# Patient Record
Sex: Male | Born: 1990 | Race: White | Hispanic: No | Marital: Single | Smoking: Never smoker
Health system: Southern US, Community
[De-identification: ages and names within clinical notes are randomized; demographics above are authoritative.]

## PROBLEM LIST (undated history)

## (undated) HISTORY — PX: BACK SURGERY: SHX140

---

## 2015-01-27 DIAGNOSIS — I10 Essential (primary) hypertension: Secondary | ICD-10-CM | POA: Insufficient documentation

## 2017-12-06 DIAGNOSIS — Z981 Arthrodesis status: Secondary | ICD-10-CM | POA: Insufficient documentation

## 2017-12-06 DIAGNOSIS — M4324 Fusion of spine, thoracic region: Secondary | ICD-10-CM | POA: Insufficient documentation

## 2019-01-18 DIAGNOSIS — G894 Chronic pain syndrome: Secondary | ICD-10-CM | POA: Insufficient documentation

## 2020-02-27 DIAGNOSIS — R531 Weakness: Secondary | ICD-10-CM | POA: Insufficient documentation

## 2020-03-10 DIAGNOSIS — M5412 Radiculopathy, cervical region: Secondary | ICD-10-CM | POA: Insufficient documentation

## 2020-03-10 DIAGNOSIS — F1320 Sedative, hypnotic or anxiolytic dependence, uncomplicated: Secondary | ICD-10-CM | POA: Insufficient documentation

## 2020-03-10 DIAGNOSIS — F1120 Opioid dependence, uncomplicated: Secondary | ICD-10-CM | POA: Insufficient documentation

## 2020-03-10 DIAGNOSIS — F330 Major depressive disorder, recurrent, mild: Secondary | ICD-10-CM | POA: Insufficient documentation

## 2020-03-10 DIAGNOSIS — F419 Anxiety disorder, unspecified: Secondary | ICD-10-CM | POA: Insufficient documentation

## 2020-03-13 DIAGNOSIS — K219 Gastro-esophageal reflux disease without esophagitis: Secondary | ICD-10-CM | POA: Insufficient documentation

## 2020-05-30 DIAGNOSIS — N319 Neuromuscular dysfunction of bladder, unspecified: Secondary | ICD-10-CM | POA: Insufficient documentation

## 2020-07-20 ENCOUNTER — Observation Stay (HOSPITAL_COMMUNITY): Payer: Medicaid Other

## 2020-07-20 ENCOUNTER — Emergency Department (HOSPITAL_COMMUNITY): Payer: Medicaid Other

## 2020-07-20 ENCOUNTER — Inpatient Hospital Stay (HOSPITAL_COMMUNITY)
Admission: EM | Admit: 2020-07-20 | Discharge: 2020-07-26 | DRG: 880 | Disposition: A | Discharge status: Home / Self Care | Payer: Medicaid Other | Attending: Family Medicine | Admitting: Family Medicine

## 2020-07-20 ENCOUNTER — Other Ambulatory Visit: Payer: Self-pay

## 2020-07-20 ENCOUNTER — Encounter (HOSPITAL_COMMUNITY): Payer: Self-pay | Admitting: Emergency Medicine

## 2020-07-20 DIAGNOSIS — I10 Essential (primary) hypertension: Secondary | ICD-10-CM | POA: Diagnosis present

## 2020-07-20 DIAGNOSIS — Z20822 Contact with and (suspected) exposure to covid-19: Secondary | ICD-10-CM | POA: Diagnosis present

## 2020-07-20 DIAGNOSIS — G8929 Other chronic pain: Secondary | ICD-10-CM | POA: Diagnosis present

## 2020-07-20 DIAGNOSIS — F449 Dissociative and conversion disorder, unspecified: Principal | ICD-10-CM | POA: Diagnosis present

## 2020-07-20 DIAGNOSIS — M549 Dorsalgia, unspecified: Secondary | ICD-10-CM

## 2020-07-20 DIAGNOSIS — F329 Major depressive disorder, single episode, unspecified: Secondary | ICD-10-CM | POA: Diagnosis present

## 2020-07-20 DIAGNOSIS — E669 Obesity, unspecified: Secondary | ICD-10-CM | POA: Diagnosis present

## 2020-07-20 DIAGNOSIS — N3946 Mixed incontinence: Secondary | ICD-10-CM | POA: Diagnosis present

## 2020-07-20 DIAGNOSIS — Z7984 Long term (current) use of oral hypoglycemic drugs: Secondary | ICD-10-CM

## 2020-07-20 DIAGNOSIS — R262 Difficulty in walking, not elsewhere classified: Secondary | ICD-10-CM | POA: Diagnosis present

## 2020-07-20 DIAGNOSIS — Z8249 Family history of ischemic heart disease and other diseases of the circulatory system: Secondary | ICD-10-CM

## 2020-07-20 DIAGNOSIS — M545 Low back pain, unspecified: Secondary | ICD-10-CM

## 2020-07-20 DIAGNOSIS — Z981 Arthrodesis status: Secondary | ICD-10-CM

## 2020-07-20 DIAGNOSIS — R32 Unspecified urinary incontinence: Secondary | ICD-10-CM

## 2020-07-20 DIAGNOSIS — R630 Anorexia: Secondary | ICD-10-CM | POA: Diagnosis present

## 2020-07-20 DIAGNOSIS — F339 Major depressive disorder, recurrent, unspecified: Secondary | ICD-10-CM

## 2020-07-20 DIAGNOSIS — Z6841 Body Mass Index (BMI) 40.0 and over, adult: Secondary | ICD-10-CM

## 2020-07-20 DIAGNOSIS — R52 Pain, unspecified: Secondary | ICD-10-CM | POA: Diagnosis not present

## 2020-07-20 DIAGNOSIS — F411 Generalized anxiety disorder: Secondary | ICD-10-CM | POA: Diagnosis present

## 2020-07-20 DIAGNOSIS — R29898 Other symptoms and signs involving the musculoskeletal system: Secondary | ICD-10-CM

## 2020-07-20 DIAGNOSIS — R339 Retention of urine, unspecified: Secondary | ICD-10-CM | POA: Diagnosis present

## 2020-07-20 LAB — TYPE AND SCREEN
ABO/RH(D): A POS
Antibody Screen: NEGATIVE

## 2020-07-20 LAB — CREATININE, SERUM
Creatinine, Ser: 0.83 mg/dL (ref 0.61–1.24)
GFR, Estimated: >60 mL/min (ref >60)

## 2020-07-20 LAB — CBC WITH DIFFERENTIAL/PLATELET
Abs Immature Granulocytes: 0.02 10*3/uL (ref 0.00–0.07)
Basophils Absolute: 0 10*3/uL (ref 0.0–0.1)
Basophils Relative: 1 %
Eosinophils Absolute: 0 10*3/uL (ref 0.0–0.5)
Eosinophils Relative: 0 %
HCT: 44.3 % (ref 39.0–52.0)
Hemoglobin: 14.6 g/dL (ref 13.0–17.0)
Immature Granulocytes: 0 %
Lymphocytes Relative: 26 %
Lymphs Abs: 2.3 10*3/uL (ref 0.7–4.0)
MCH: 29.9 pg (ref 26.0–34.0)
MCHC: 33 g/dL (ref 30.0–36.0)
MCV: 90.8 fL (ref 80.0–100.0)
Monocytes Absolute: 0.6 10*3/uL (ref 0.1–1.0)
Monocytes Relative: 7 %
Neutro Abs: 5.8 10*3/uL (ref 1.7–7.7)
Neutrophils Relative %: 66 %
Platelets: 303 10*3/uL (ref 150–400)
RBC: 4.88 MIL/uL (ref 4.22–5.81)
RDW: 13.5 % (ref 11.5–15.5)
WBC: 8.7 10*3/uL (ref 4.0–10.5)
nRBC: 0 % (ref 0.0–0.2)

## 2020-07-20 LAB — RAPID URINE DRUG SCREEN, HOSP PERFORMED
Amphetamines: NOT DETECTED
Barbiturates: NOT DETECTED
Benzodiazepines: POSITIVE — AB
Cocaine: NOT DETECTED
Opiates: NOT DETECTED
Tetrahydrocannabinol: NOT DETECTED

## 2020-07-20 LAB — CBC
HCT: 41.2 % (ref 39.0–52.0)
Hemoglobin: 13.7 g/dL (ref 13.0–17.0)
MCH: 30 pg (ref 26.0–34.0)
MCHC: 33.3 g/dL (ref 30.0–36.0)
MCV: 90.4 fL (ref 80.0–100.0)
Platelets: 268 10*3/uL (ref 150–400)
RBC: 4.56 MIL/uL (ref 4.22–5.81)
RDW: 13.5 % (ref 11.5–15.5)
WBC: 10.2 10*3/uL (ref 4.0–10.5)
nRBC: 0 % (ref 0.0–0.2)

## 2020-07-20 LAB — RESP PANEL BY RT-PCR (FLU A&B, COVID) ARPGX2
Influenza A by PCR: NEGATIVE
Influenza B by PCR: NEGATIVE
SARS Coronavirus 2 by RT PCR: NEGATIVE

## 2020-07-20 LAB — BASIC METABOLIC PANEL
Anion gap: 10 (ref 5–15)
BUN: 9 mg/dL (ref 6–20)
CO2: 28 mmol/L (ref 22–32)
Calcium: 9.4 mg/dL (ref 8.9–10.3)
Chloride: 101 mmol/L (ref 98–111)
Creatinine, Ser: 0.87 mg/dL (ref 0.61–1.24)
GFR, Estimated: >60 mL/min (ref >60)
Glucose, Bld: 100 mg/dL — ABNORMAL HIGH (ref 70–99)
Potassium: 3.8 mmol/L (ref 3.5–5.1)
Sodium: 139 mmol/L (ref 135–145)

## 2020-07-20 LAB — ABO/RH: ABO/RH(D): A POS

## 2020-07-20 LAB — HIV ANTIBODY (ROUTINE TESTING W REFLEX): HIV Screen 4th Generation wRfx: NONREACTIVE

## 2020-07-20 IMAGING — MR MR LUMBAR SPINE W/O CM
5 of 6 series · 29 of 48 positions shown · non-contrast
Comparison: Radiography earlier same day

CLINICAL DATA: Fell yesterday with leg weakness and back pain.
Unable to urinate.

EXAM:
MRI LUMBAR SPINE WITHOUT CONTRAST
TECHNIQUE: Multiplanar, multisequence MR imaging of the lumbar spine was
performed. No intravenous contrast was administered.

[Series 16: t2_tse_warp_sag · sagittal · 4.0mm · 0.81mm/px · 3 of 17 slices shown]
[im 1/17]
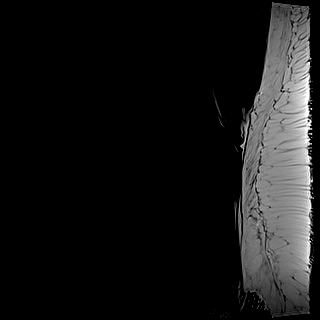
[im 9/17]
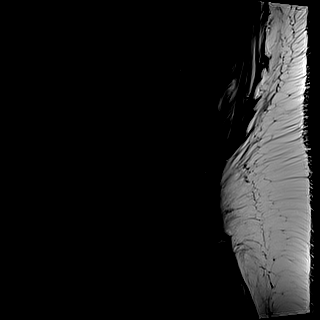
[im 17/17]
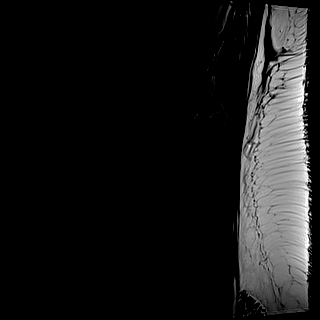

[Series 17: t2_tse_stir_warp_sag · sagittal · 4.0mm · 1.02mm/px · 3 of 17 slices shown]
[im 1/17]
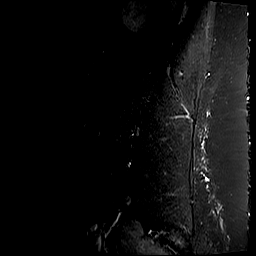
[im 9/17]
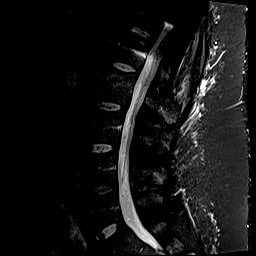
[im 17/17]
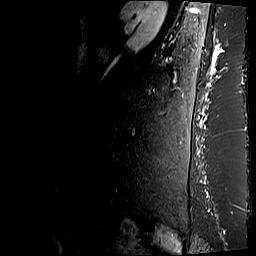

[Series 18: t2_tse_warp_tra · axial · 4.0mm · 0.78mm/px · z∈[-616,-365]mm · 10 of 54 slices shown]
[im 1/54]
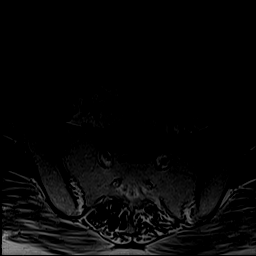
[im 6/54]
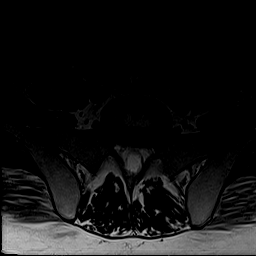
[im 12/54]
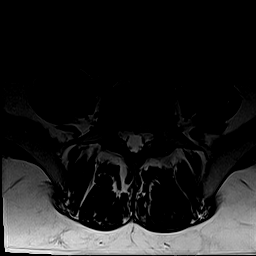
[im 18/54]
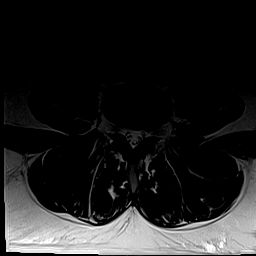
[im 24/54]
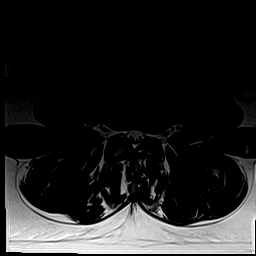
[im 30/54]
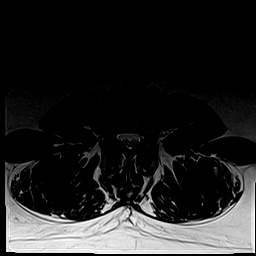
[im 36/54]
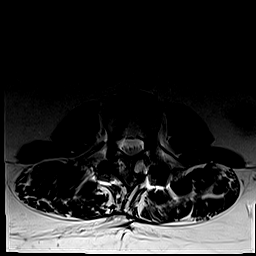
[im 42/54]
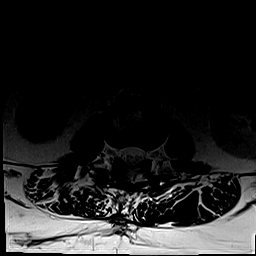
[im 48/54]
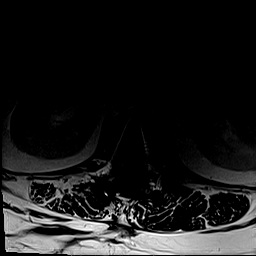
[im 54/54]
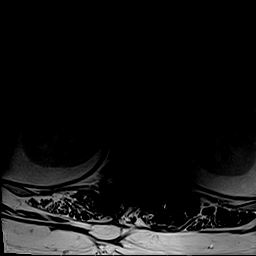

[Series 19: t1_tse_warp_tra · axial · 4.0mm · 0.39mm/px · z∈[-616,-365]mm · 10 of 54 slices shown]
[im 1/54]
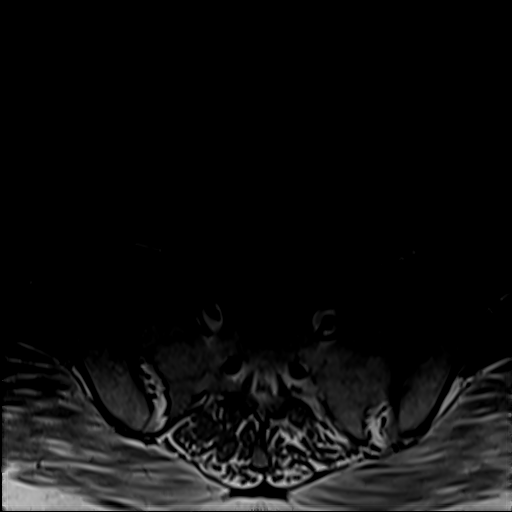
[im 6/54]
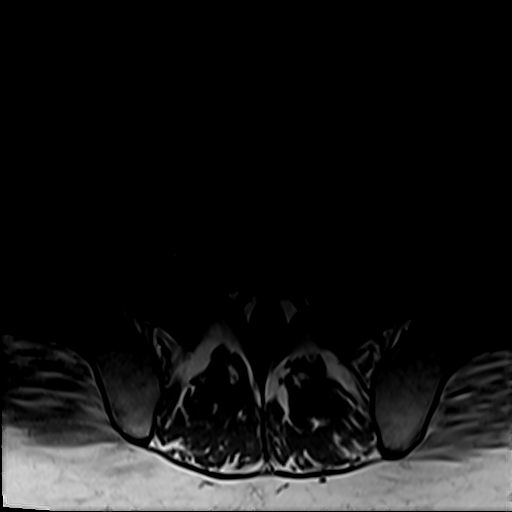
[im 12/54]
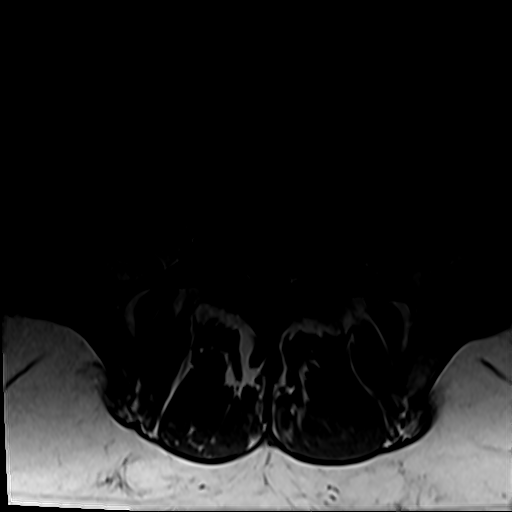
[im 18/54]
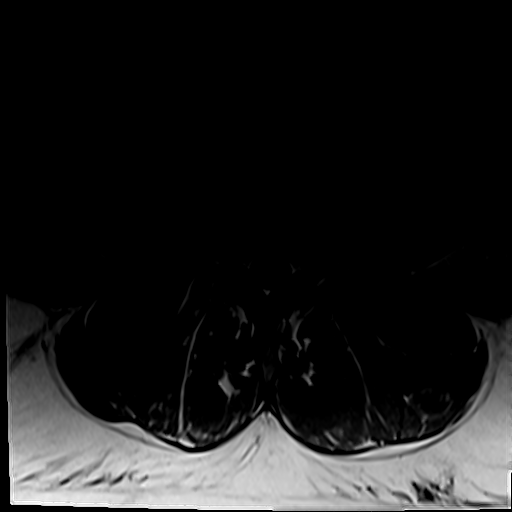
[im 24/54]
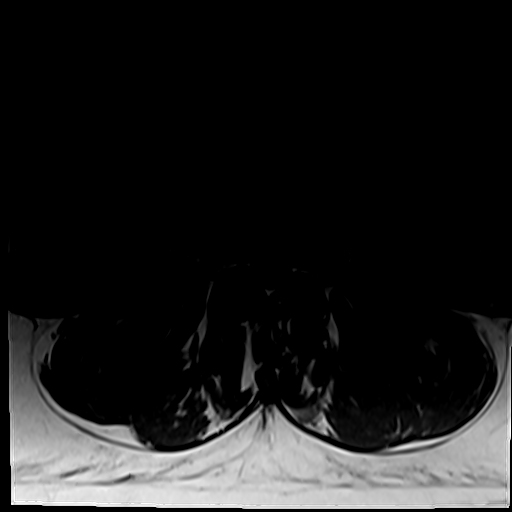
[im 30/54]
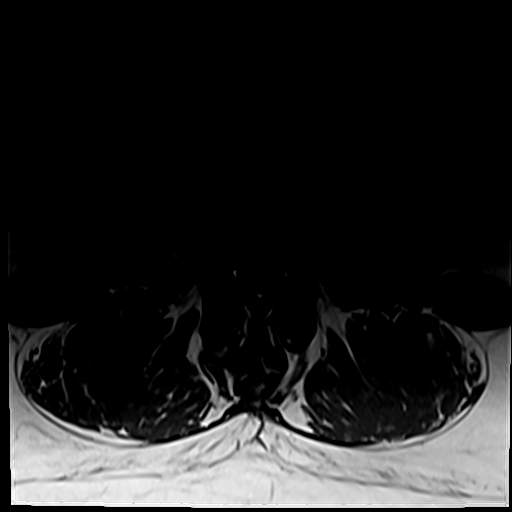
[im 36/54]
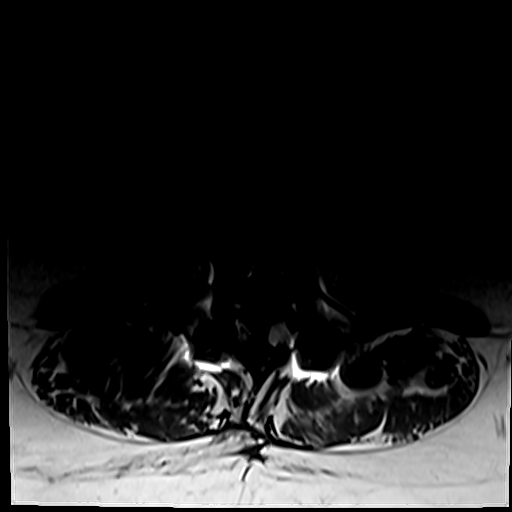
[im 42/54]
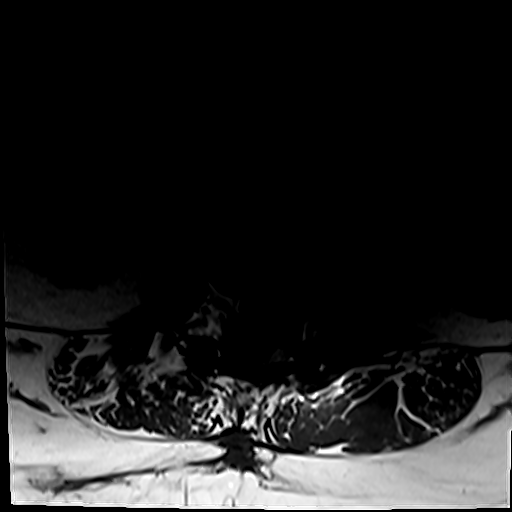
[im 48/54]
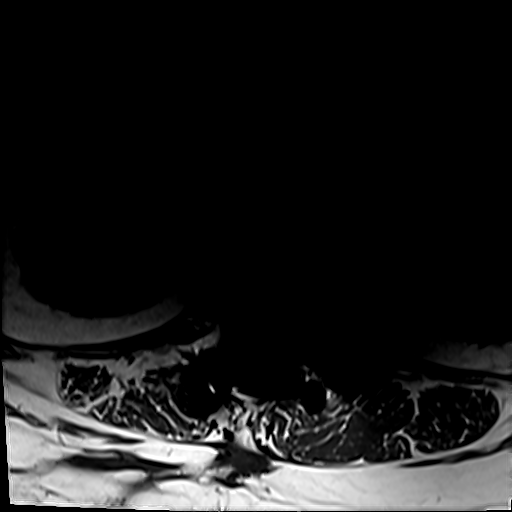
[im 54/54]
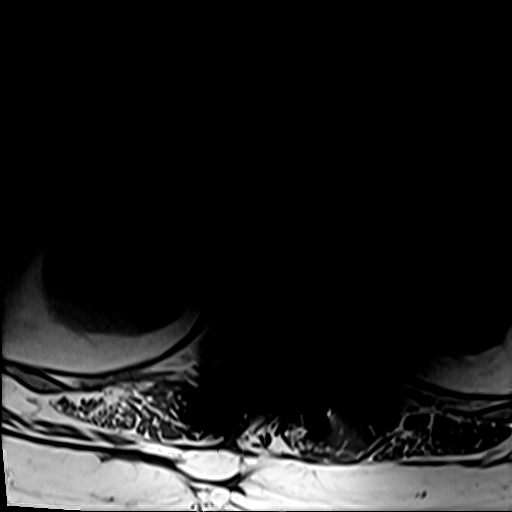

[Series 20: t1_tse_warp_sag · sagittal · 4.0mm · 0.81mm/px · 3 of 17 slices shown]
[im 1/17]
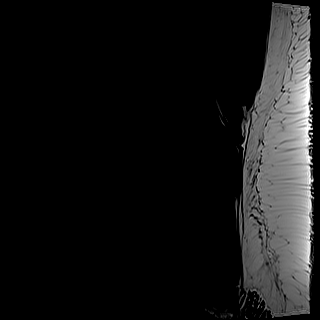
[im 9/17]
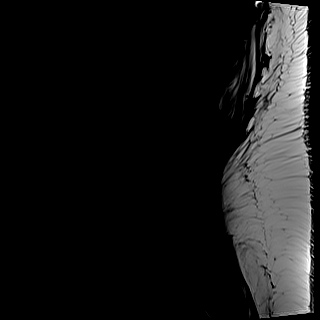
[im 17/17]
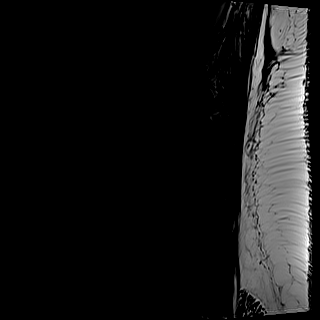

[29 of 48 positions shown; findings below may reference images not displayed]

FINDINGS: Segmentation:  5 lumbar type vertebral bodies.

Alignment:  Normal

Vertebrae: Thoracolumbar fusion extending as low as L2. No
abnormality seen below that.

Conus medullaris and cauda equina: Conus extends to the L1 level.
Conus and cauda equina appear normal.

Paraspinal and other soft tissues: Negative

Disc levels:

L1-2: Normal.  Solid fusion.

L2-3: No disc abnormality. Mild facet degeneration and hypertrophy.
No compressive stenosis.

L3-4: No disc abnormality. Minimal facet and ligamentous prominence.
No stenosis.

L4-5: Minimal bulging of the disc. Minimal facet and ligamentous
prominence. No compressive stenosis.

L5-S1: Normal interspace.
IMPRESSION: Thoracolumbar fusion extends down as far as L2. The L1-2 level is
unremarkable. No compressive stenosis from L2-3 through L5-S1. At
L3-4, there is some adjacent segment facet degeneration and
hypertrophy but without discernible edematous change or compressive
encroachment upon the neural spaces.

## 2020-07-20 IMAGING — DX DG THORACOLUMBAR SPINE 2V
6 series · 6 of 6 positions shown · non-contrast
Comparison: None

CLINICAL DATA: Fell yesterday, legs came out, severe back pain,
groin numbness, unable to urinate, history of back surgery

EXAM:
THORACOLUMBAR SPINE 1V

[l-spine spot]
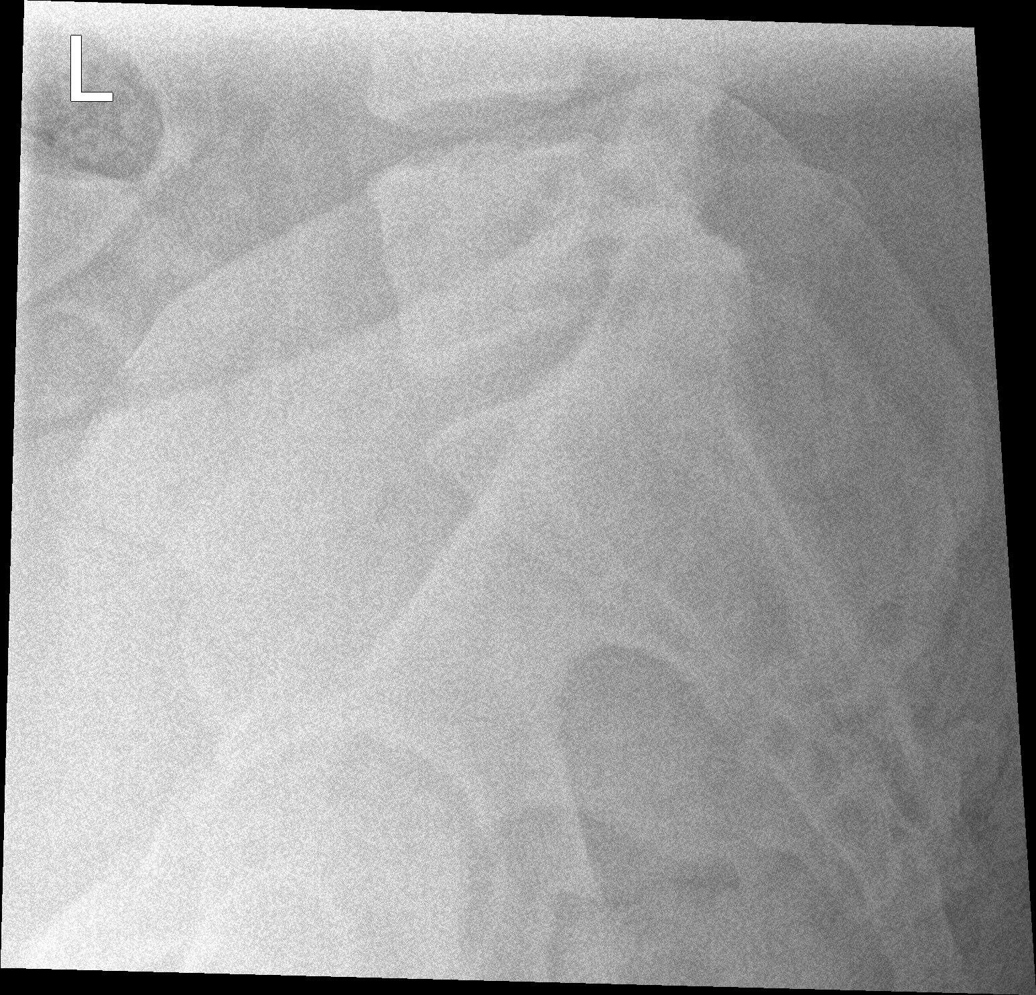

[l-spine ap (1 of 4)]
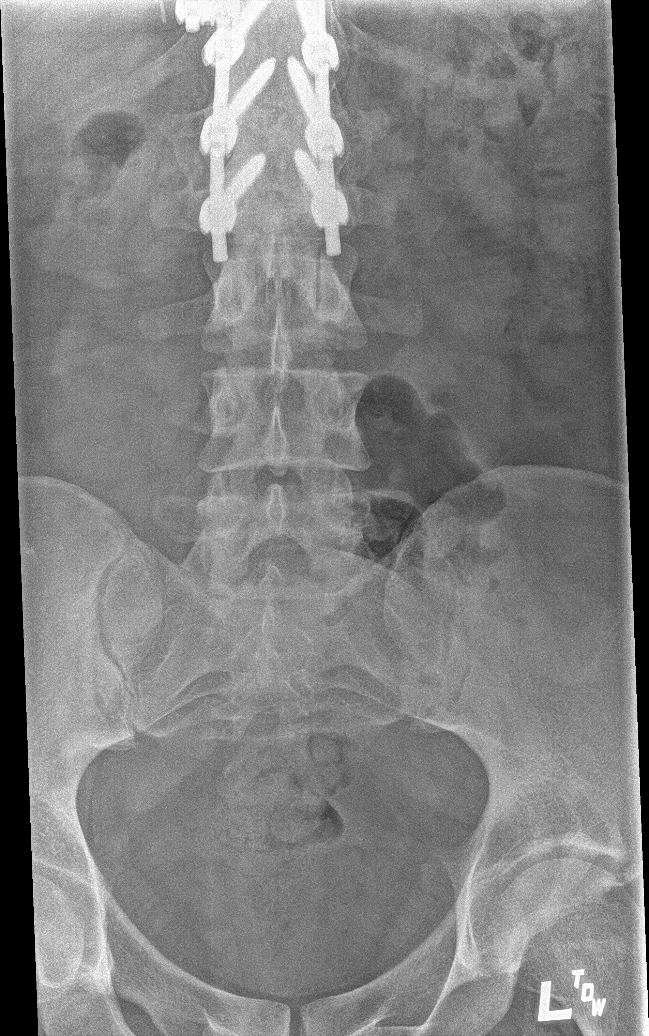

[l-spine ap (2 of 4)]
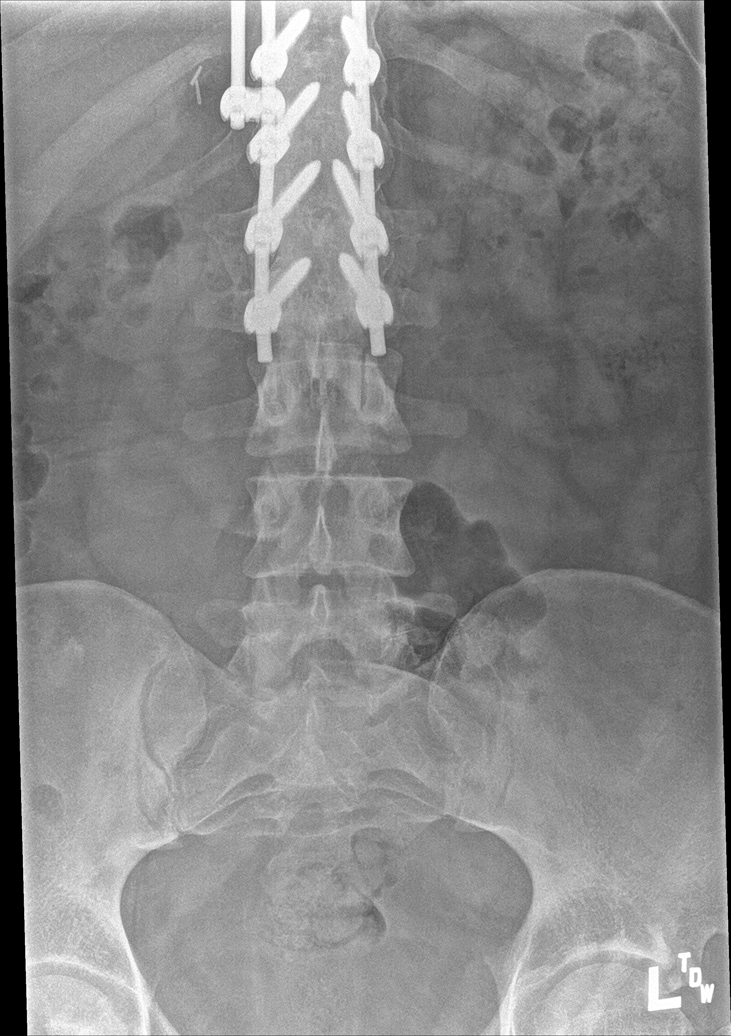

[l-spine ap (3 of 4)]
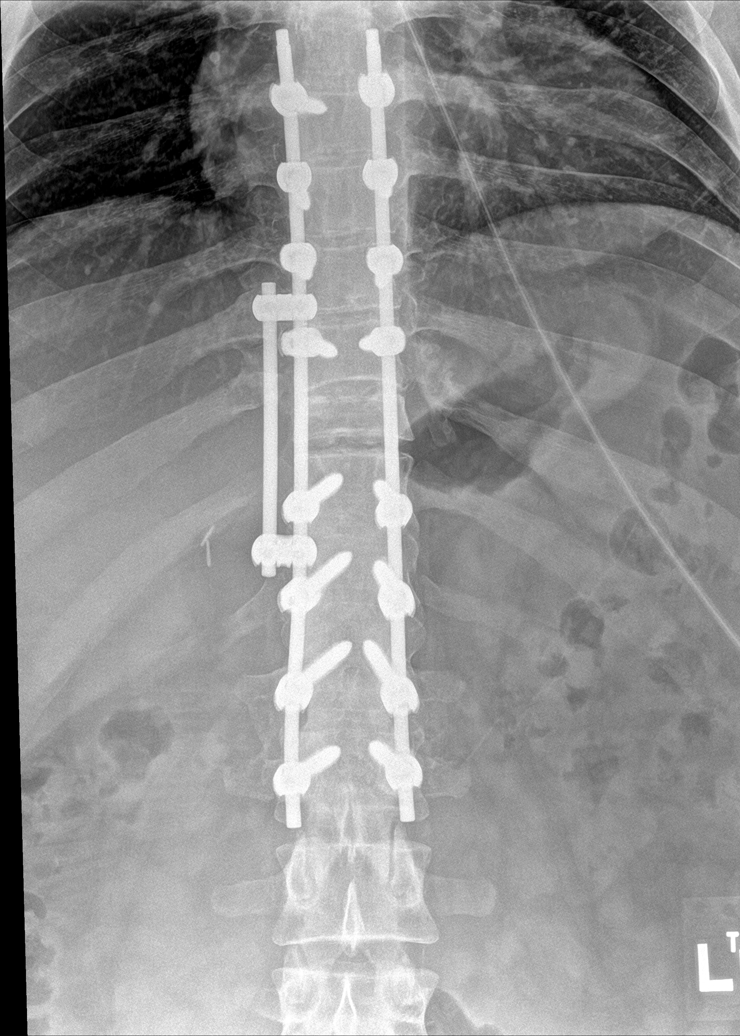

[l-spine lat]
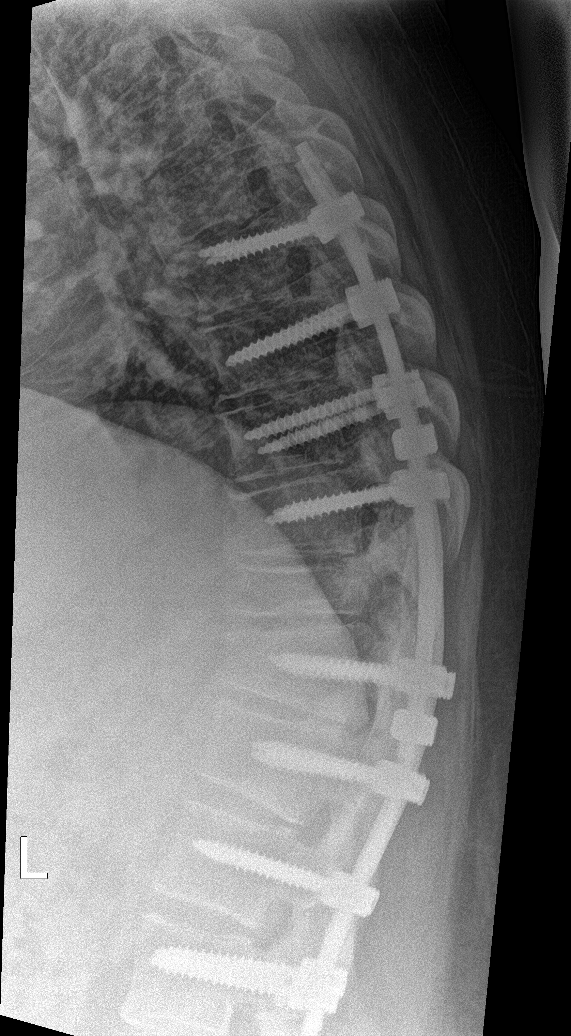

[l-spine ap (4 of 4)]
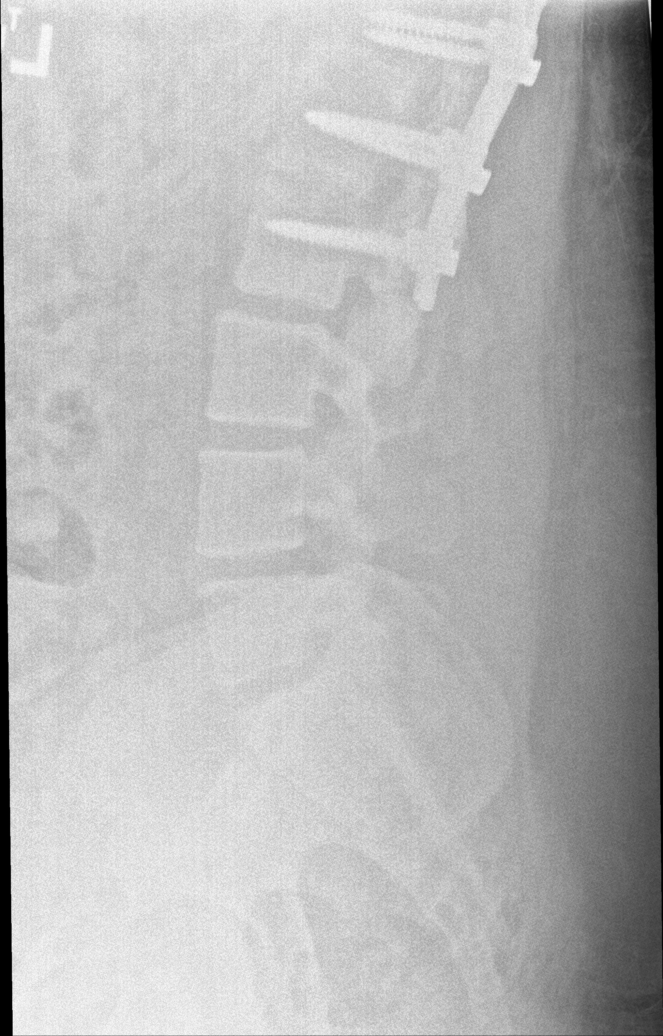

[6 of 6 positions shown; findings below may reference images not displayed]

FINDINGS: Prior thoracolumbar fusion, T6-L2.

Hardware appears intact.

5 non-rib-bearing lumbar vertebra.

Compression fractures of T10 and superior endplates of T9 and T11,
appear old.

Remaining vertebral body heights maintained.

No additional fracture,, subluxation, or bone destruction.

SI joints preserved.
IMPRESSION: Prior T6-L2 fusion.

Old appearing compression fractures of T10 and superior endplate T9.

No acute osseous abnormalities.

## 2020-07-20 IMAGING — MR MR THORACIC SPINE W/O CM
8 of 12 series · 23 of 48 positions shown · non-contrast
Comparison: Radiography earlier same day

CLINICAL DATA: Back pain.  Fell.  Unable to urinate.

EXAM:
MRI THORACIC SPINE WITHOUT CONTRAST
TECHNIQUE: Multiplanar, multisequence MR imaging of the thoracic spine was
performed. No intravenous contrast was administered.

[Series 21: T1 · sagittal · 3.3mm · 0.62mm/px · 1 of 7 slices shown (1 of 5)]
[im 1/7]
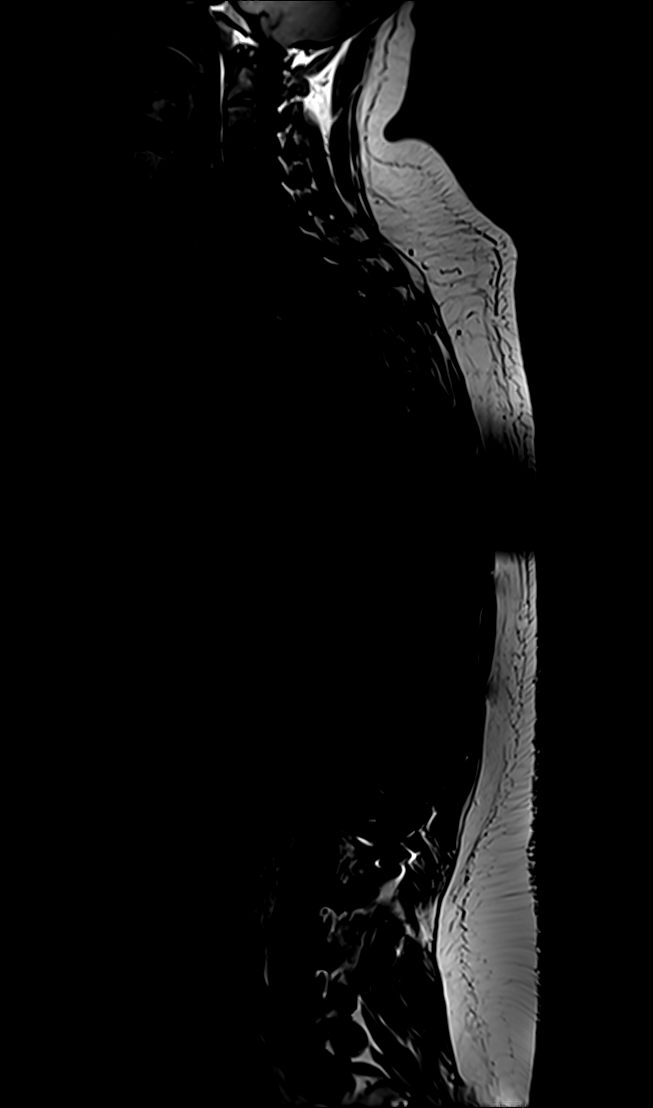

[Series 22: T2 · sagittal · 3.0mm · 0.80mm/px · 3 of 19 slices shown (1 of 3)]
[im 1/19]
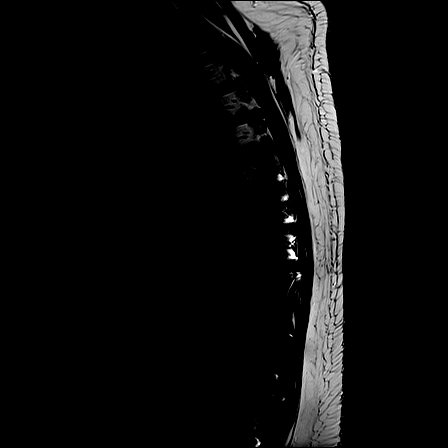
[im 10/19]
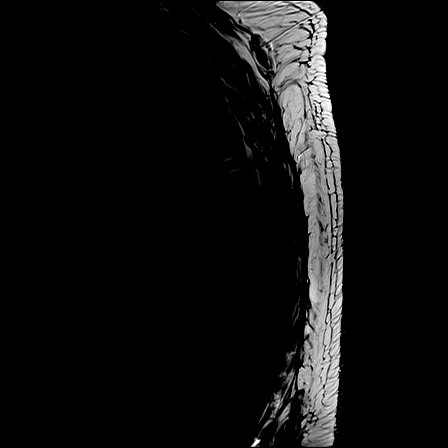
[im 19/19]
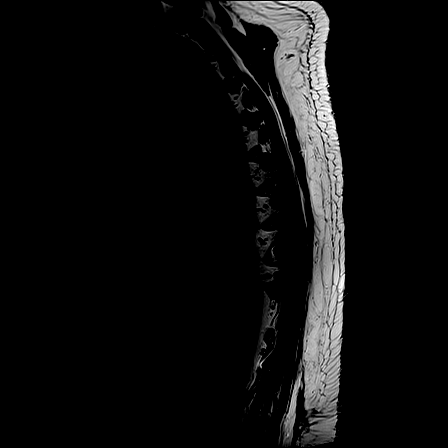

[Series 23: T1 · sagittal · 3.0mm · 0.80mm/px · 3 of 19 slices shown (2 of 5)]
[im 1/19]
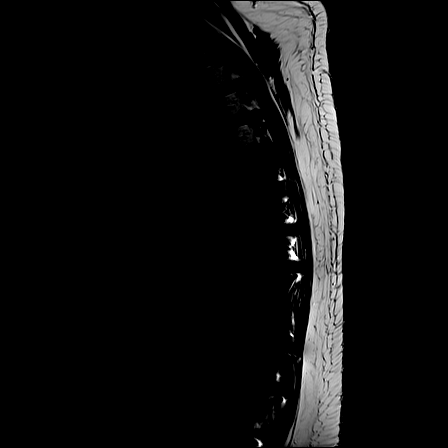
[im 10/19]
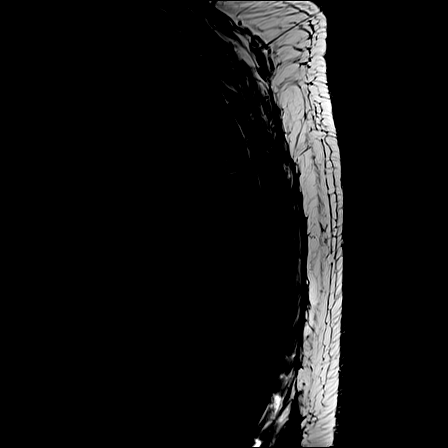
[im 19/19]
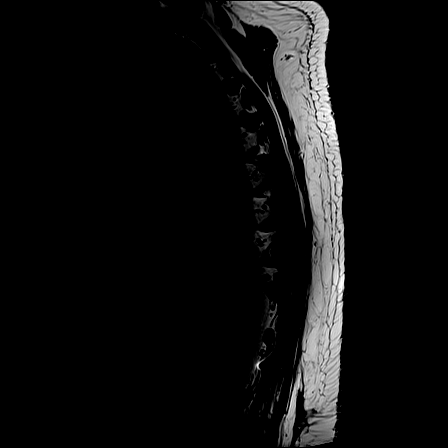

[Series 25: T1 · sagittal · 3.0mm · 1.12mm/px · 3 of 19 slices shown (3 of 5)]
[im 1/19]
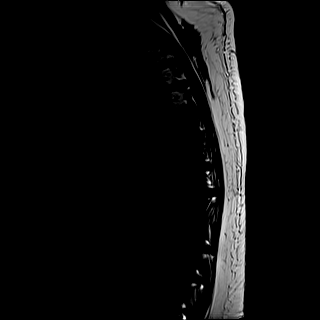
[im 10/19]
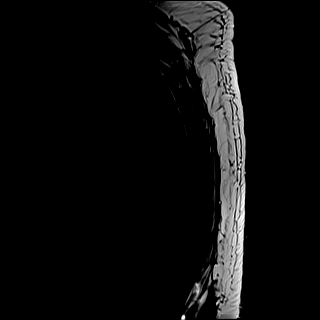
[im 19/19]
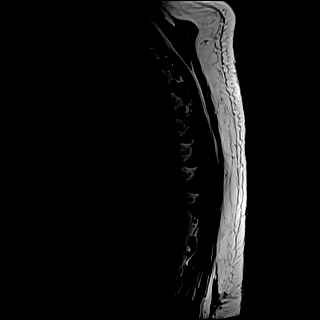

[Series 26: T2 · axial · 5.0mm · 0.59mm/px · z∈[-282,-89]mm · 4 of 28 slices shown (2 of 3)]
[im 1/28]
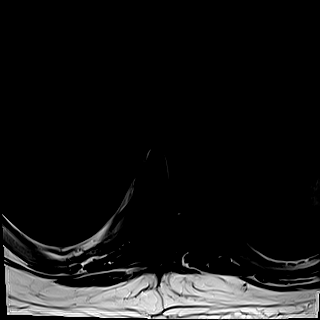
[im 10/28]
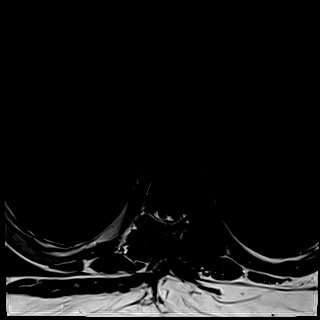
[im 19/28]
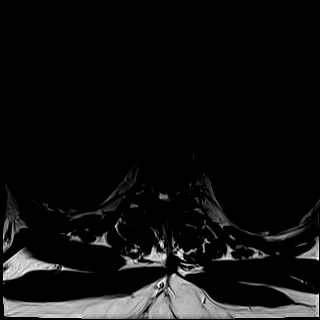
[im 28/28]
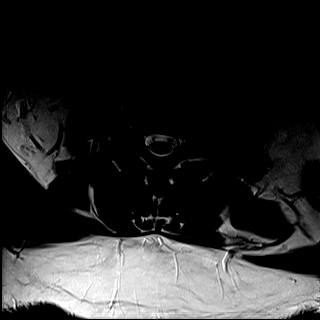

[Series 27: T2 · axial · 5.0mm · 0.59mm/px · z∈[-421,-249]mm · 3 of 24 slices shown (3 of 3)]
[im 1/24]
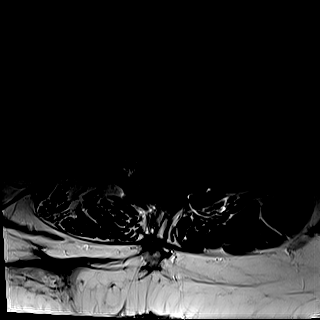
[im 12/24]
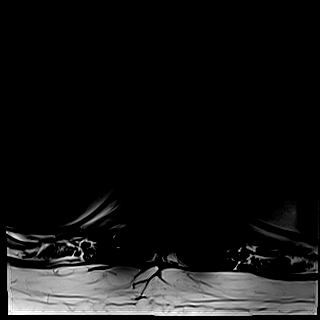
[im 24/24]
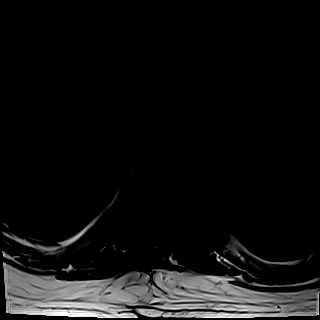

[Series 29: T1 · axial · non-contrast · 5.0mm · 0.31mm/px · z∈[-282,-89]mm · 4 of 28 slices shown (4 of 5)]
[im 1/28]
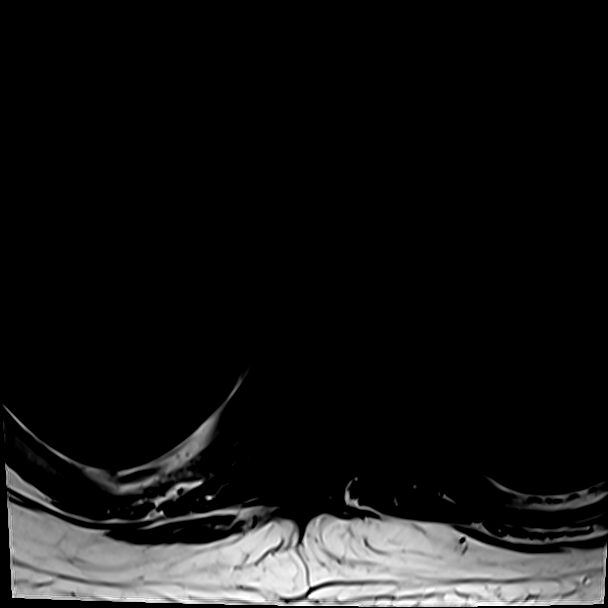
[im 10/28]
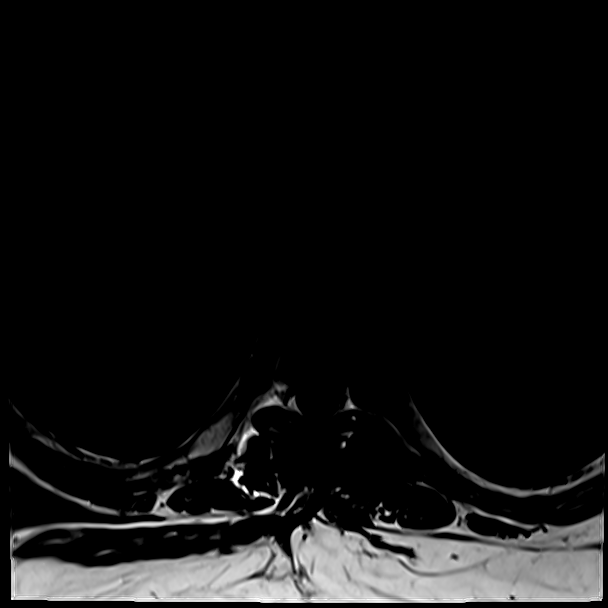
[im 19/28]
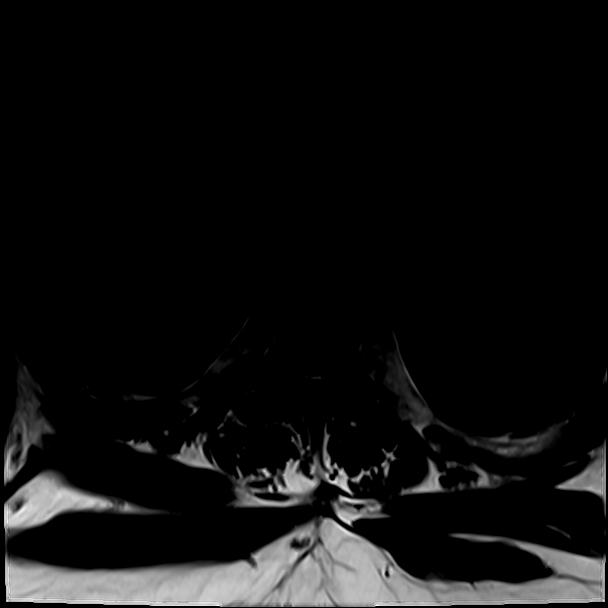
[im 28/28]
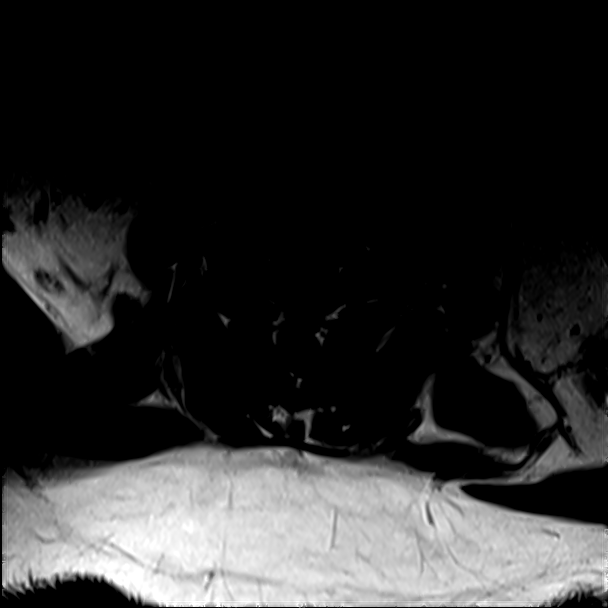

[Series 30: T1 · axial · non-contrast · 5.0mm · 0.31mm/px · z∈[-421,-339]mm · 2 of 24 slices shown (5 of 5)]
[im 1/24]
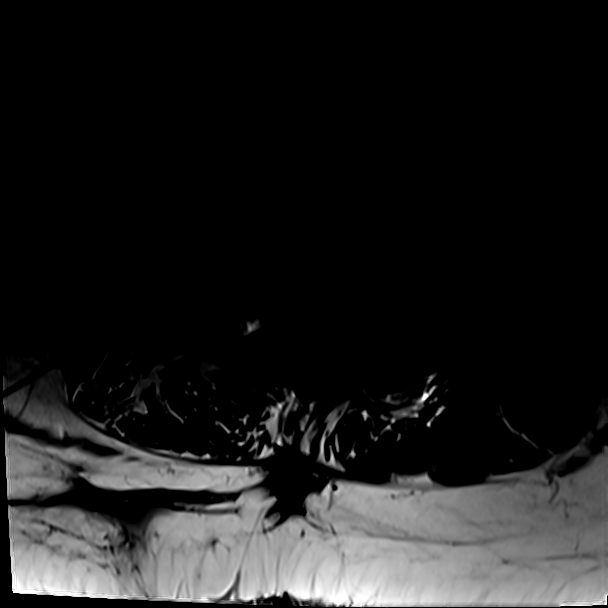
[im 12/24]
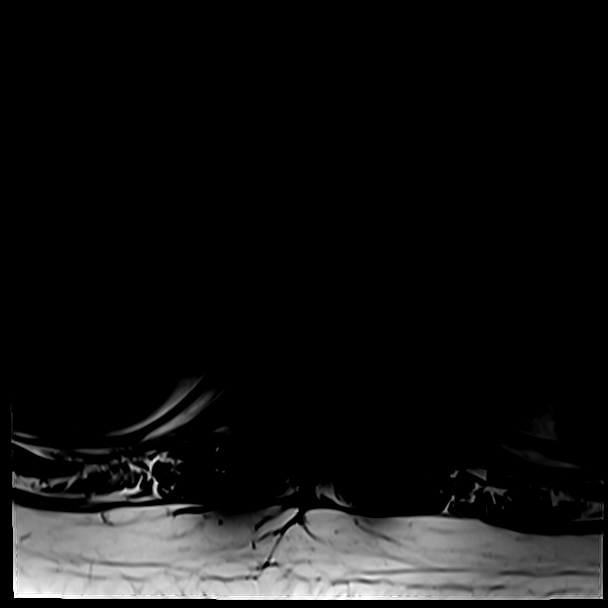

[23 of 48 positions shown; findings below may reference images not displayed]

FINDINGS: Alignment:  No malalignment.

Vertebrae: Thoracolumbar fusion procedure from T6-L2. Old
compression fracture at T10 with loss of height of 50%. Mild loss of
height also at T11 and T12.

Cord:  No cord compression or primary cord lesion.

Paraspinal and other soft tissues: No paravertebral abnormality
identified.

Disc levels:

No disc space abnormality at T5-6 or above. Mild facet and
ligamentous hypertrophy at T4-5 and T5-6 without encroachment upon
the neural structures. This could contribute to upper thoracic back
pain. The left-sided pedicle screw at T6 comes very close to the
disc space, but there does not appear to be T5-6 disc degeneration.

T6 through T12: Previous fusion procedure as above. Limited detail
because of artifact from fusion hardware. No apparent acute or
unexpected finding in that region. Apparent sufficient patency of
the canal and foramina, allowing for the artifact.
IMPRESSION: No acute traumatic finding. Previous thoracolumbar fusion from T6-L2
has a satisfactory appearance.

Mild facet and ligamentous degenerative changes and hypertrophy at
T4-5 and T5-6, but without compressive encroachment upon the canal
or foramina.

## 2020-07-20 IMAGING — MR MR CERVICAL SPINE W/O CM
5 series · 38 of 48 positions shown · non-contrast
Comparison: None.

CLINICAL DATA: Lower extremity weakness

EXAM:
MRI CERVICAL SPINE WITHOUT CONTRAST
TECHNIQUE: Multiplanar, multisequence MR imaging of the cervical spine was
performed. No intravenous contrast was administered.

[Series 5: T2 · sagittal · 3.0mm · 0.69mm/px · 6 of 15 slices shown (1 of 2)]
[im 1/15]
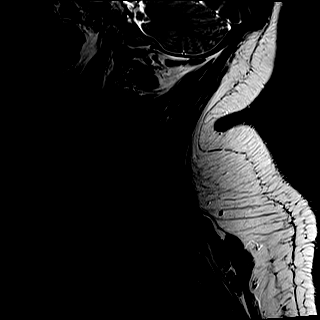
[im 3/15]
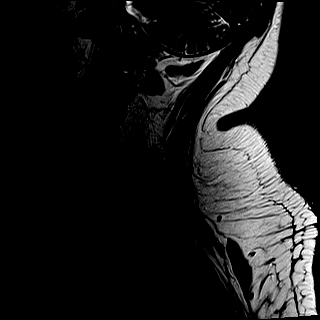
[im 6/15]
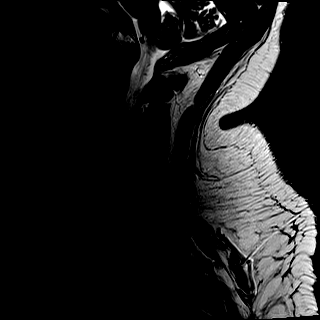
[im 9/15]
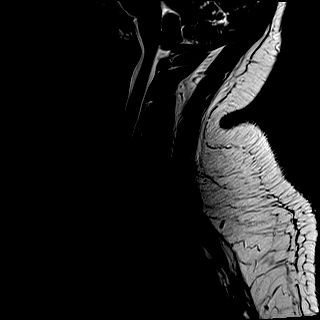
[im 12/15]
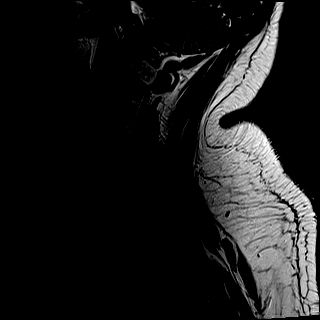
[im 15/15]
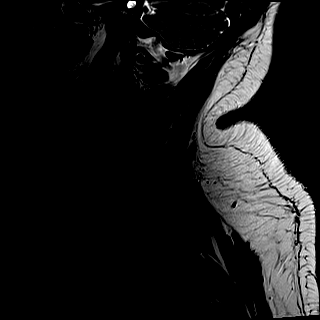

[Series 6: T1 · sagittal · 3.0mm · 0.69mm/px · 6 of 15 slices shown]
[im 1/15]
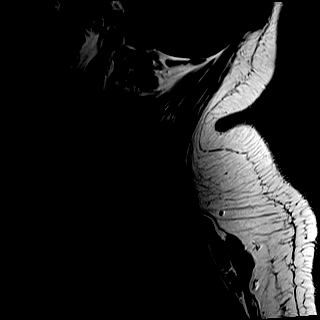
[im 3/15]
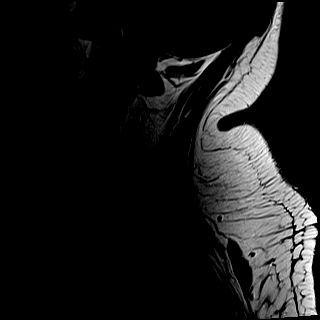
[im 6/15]
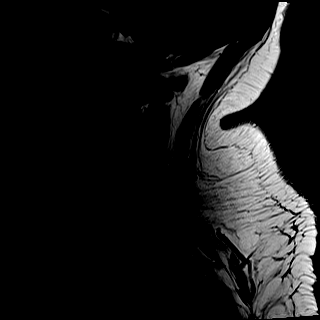
[im 9/15]
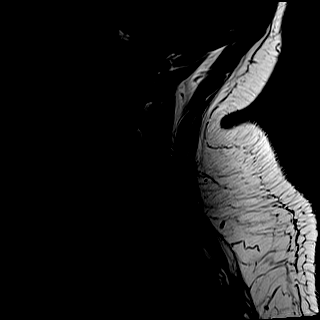
[im 12/15]
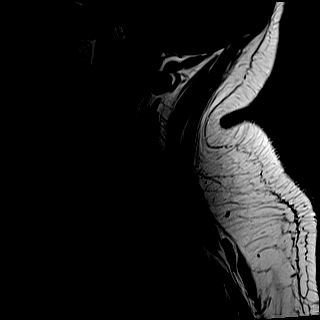
[im 15/15]
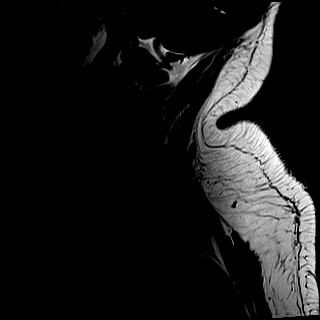

[Series 7: STIR · sagittal · 3.0mm · 0.86mm/px · 6 of 15 slices shown]
[im 1/15]
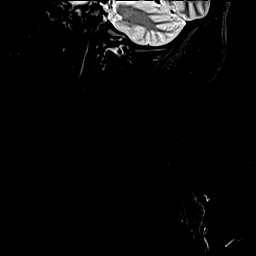
[im 3/15]
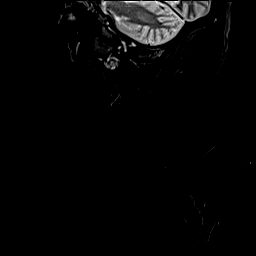
[im 6/15]
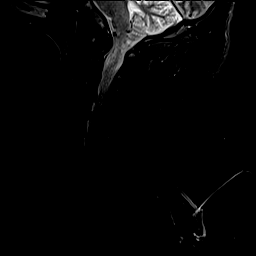
[im 9/15]
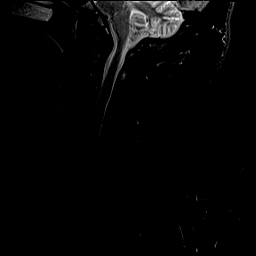
[im 12/15]
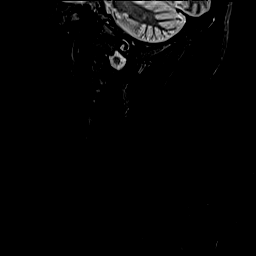
[im 15/15]
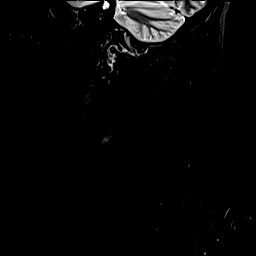

[Series 8: T2 · axial · 3.0mm · 0.66mm/px · z∈[-116,+12]mm · 12 of 40 slices shown (2 of 2)]
[im 1/40]
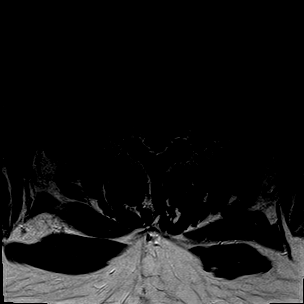
[im 3/40]
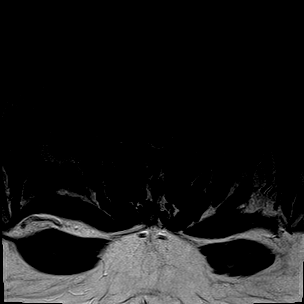
[im 6/40]
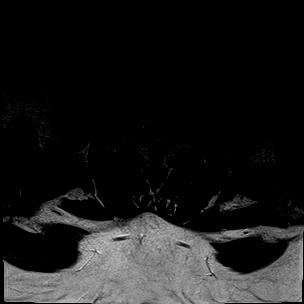
[im 9/40]
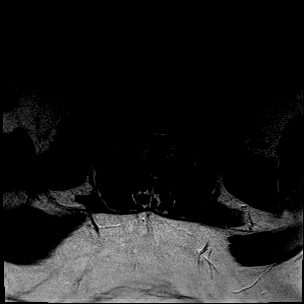
[im 12/40]
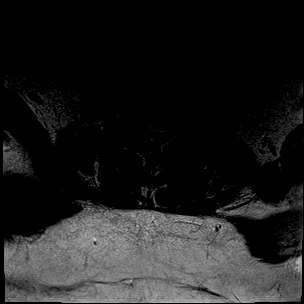
[im 14/40]
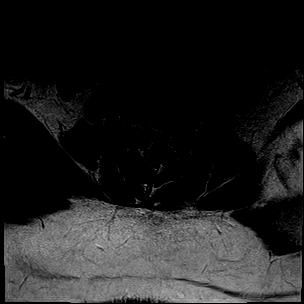
[im 17/40]
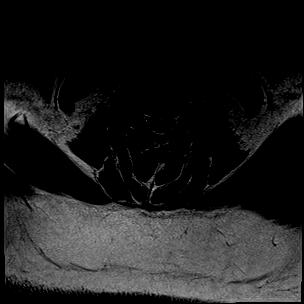
[im 20/40]
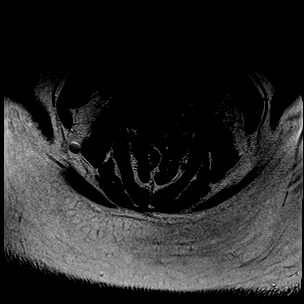
[im 23/40]
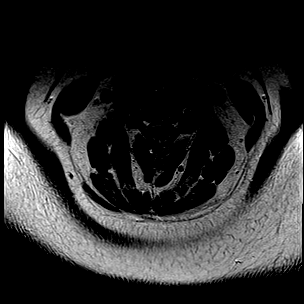
[im 28/40]
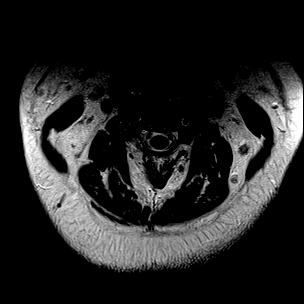
[im 34/40]
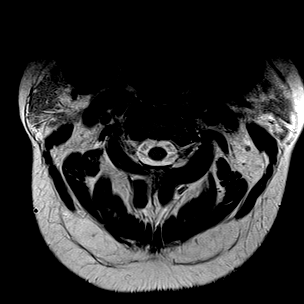
[im 40/40]
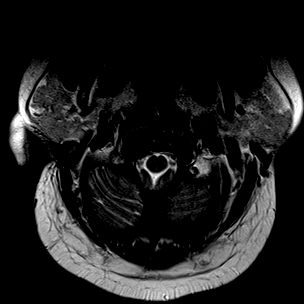

[Series 9: GRE · axial · 3.0mm · 0.39mm/px · z∈[-116,+12]mm · 8 of 40 slices shown]
[im 1/40]
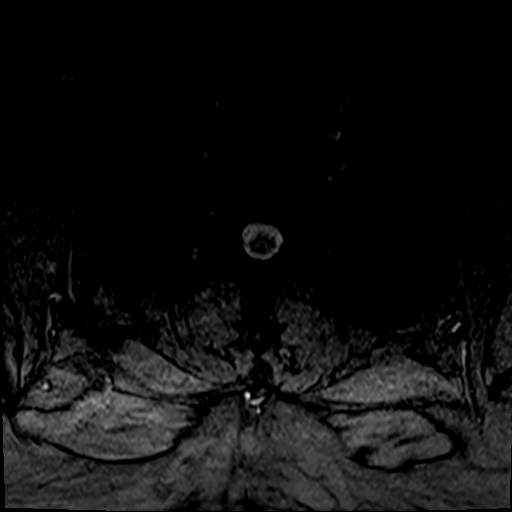
[im 6/40]
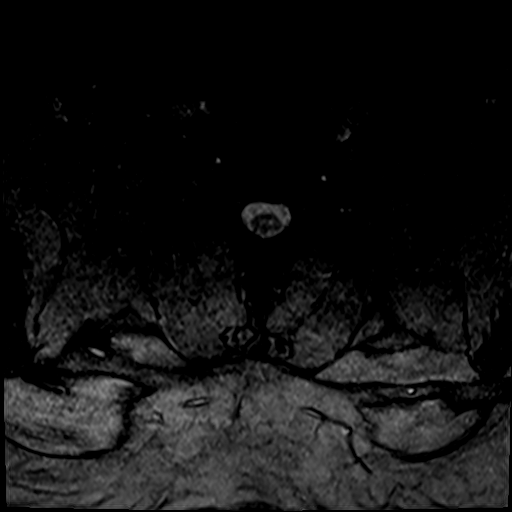
[im 12/40]
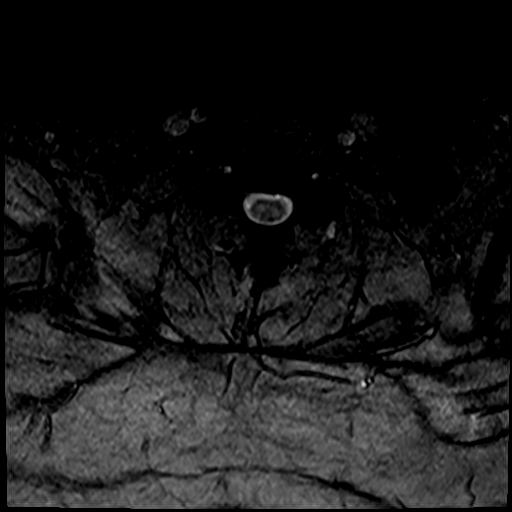
[im 17/40]
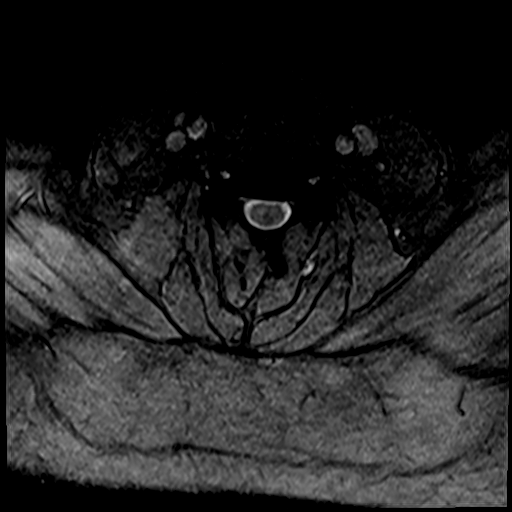
[im 23/40]
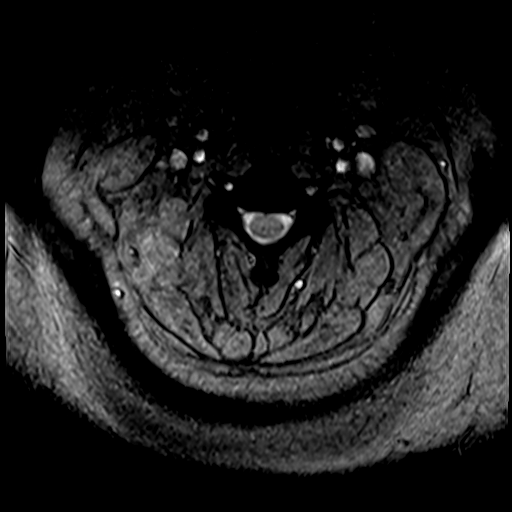
[im 28/40]
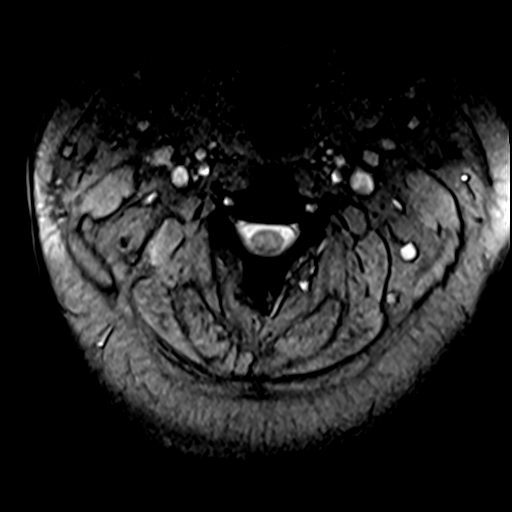
[im 34/40]
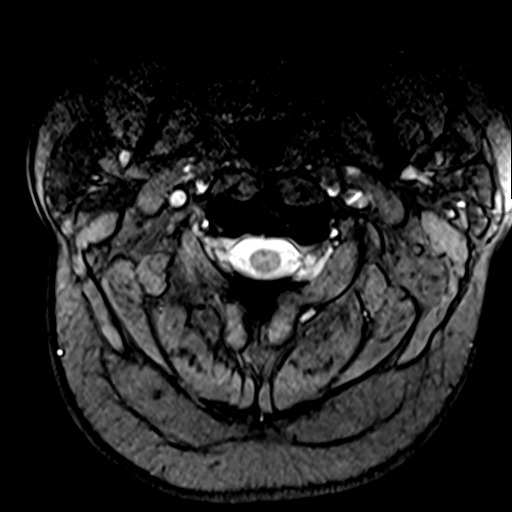
[im 40/40]
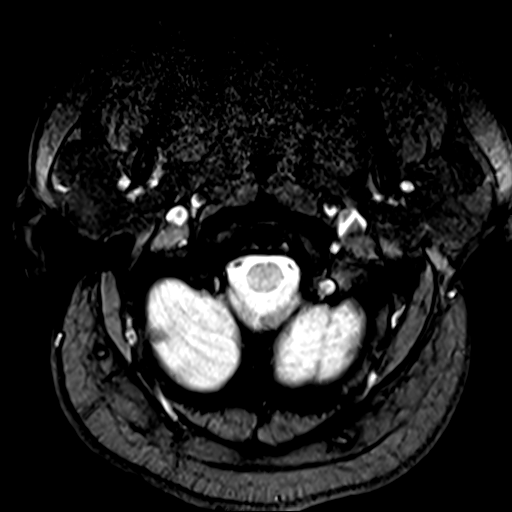

[38 of 48 positions shown; findings below may reference images not displayed]

FINDINGS: Alignment: Physiologic.

Vertebrae: No fracture, evidence of discitis, or bone lesion.

Cord: Normal signal and morphology.

Posterior Fossa, vertebral arteries, paraspinal tissues: Negative.

Disc levels:

C1-C2: Normal.

C2-C3: Normal disc space and facets. No spinal canal or
neuroforaminal stenosis.

C3-C4: Small right asymmetric disc bulge with right uncovertebral
spurring. Mild right foraminal stenosis.

C4-C5: Right asymmetric uncovertebral spurring. Mild right foraminal
stenosis.

C5-C6: Normal disc space and facets. No spinal canal or
neuroforaminal stenosis.

C6-C7: Normal disc space and facets. No spinal canal or
neuroforaminal stenosis.

C7-T1: Normal disc space and facets. No spinal canal or
neuroforaminal stenosis.
IMPRESSION: 1. No cervical spinal canal stenosis.
2. Mild right C3-C4 and C4-C5 neural foraminal stenosis due to
uncovertebral spurring.

## 2020-07-20 MED ORDER — OXYCODONE HCL 5 MG PO TABS
5.0000 mg | ORAL_TABLET | Freq: Once | ORAL | Status: AC
  Administered 2020-07-20: 5 mg via ORAL
  Filled 2020-07-20: qty 1

## 2020-07-20 MED ORDER — METFORMIN HCL ER 500 MG PO TB24
500.0000 mg | ORAL_TABLET | Freq: Every day | ORAL | Status: DC
  Administered 2020-07-21 – 2020-07-24 (×4): 500 mg via ORAL
  Filled 2020-07-20 (×4): qty 1

## 2020-07-20 MED ORDER — DIAZEPAM 5 MG PO TABS
5.0000 mg | ORAL_TABLET | Freq: Three times a day (TID) | ORAL | Status: DC | PRN
  Administered 2020-07-20 – 2020-07-24 (×5): 5 mg via ORAL
  Filled 2020-07-20 (×5): qty 1

## 2020-07-20 MED ORDER — HYDROMORPHONE HCL 1 MG/ML IJ SOLN
0.5000 mg | Freq: Once | INTRAMUSCULAR | Status: AC
  Administered 2020-07-20: 0.5 mg via INTRAVENOUS
  Filled 2020-07-20: qty 1

## 2020-07-20 MED ORDER — DULOXETINE HCL 30 MG PO CPEP
30.0000 mg | ORAL_CAPSULE | Freq: Two times a day (BID) | ORAL | Status: DC
  Administered 2020-07-20 – 2020-07-26 (×12): 30 mg via ORAL
  Filled 2020-07-20 (×13): qty 1

## 2020-07-20 MED ORDER — ENOXAPARIN SODIUM 40 MG/0.4ML IJ SOSY
40.0000 mg | PREFILLED_SYRINGE | INTRAMUSCULAR | Status: DC
  Administered 2020-07-20 – 2020-07-22 (×3): 40 mg via SUBCUTANEOUS
  Filled 2020-07-20 (×3): qty 0.4

## 2020-07-20 MED ORDER — DOXEPIN HCL 10 MG/ML PO CONC
3.0000 mg | Freq: Every day | ORAL | Status: DC
  Administered 2020-07-21 – 2020-07-25 (×6): 3 mg via ORAL
  Filled 2020-07-20 (×8): qty 0.3

## 2020-07-20 MED ORDER — MORPHINE SULFATE (PF) 4 MG/ML IV SOLN
4.0000 mg | Freq: Once | INTRAVENOUS | Status: AC
Start: 2020-07-20 — End: 2020-07-20
  Administered 2020-07-20: 4 mg via INTRAVENOUS
  Filled 2020-07-20: qty 1

## 2020-07-20 MED ORDER — ACETAMINOPHEN 325 MG PO TABS
650.0000 mg | ORAL_TABLET | Freq: Four times a day (QID) | ORAL | Status: DC | PRN
  Administered 2020-07-20: 650 mg via ORAL
  Filled 2020-07-20: qty 2

## 2020-07-20 MED ORDER — PREGABALIN 100 MG PO CAPS
200.0000 mg | ORAL_CAPSULE | Freq: Three times a day (TID) | ORAL | Status: DC
  Administered 2020-07-20 – 2020-07-25 (×14): 200 mg via ORAL
  Filled 2020-07-20: qty 4
  Filled 2020-07-20 (×13): qty 2

## 2020-07-20 NOTE — ED Notes (Signed)
Patient transported to MRI 

## 2020-07-20 NOTE — ED Notes (Signed)
Unable to obtain vitals, Pt at MRI.

## 2020-07-20 NOTE — ED Triage Notes (Addendum)
Pt fell yesterday.  States legs "gave out".  History of back surgery.  C/o groin numbness, unable to urinate, and severe back pain.

## 2020-07-20 NOTE — ED Notes (Signed)
Patient transported to X-ray 

## 2020-07-20 NOTE — Progress Notes (Signed)
FPTS Interim Progress Note Received a page from RN about patient wanting pain meds before going to MRI as he was unable to tolerate MRI last time without his pain medication Per PDMP review patient had his oxycodone 10 mg tabs refilled last time on 06/01/2020 for 5 days.  His UDS does not show any opiate in the urine. -- Ordered 5 mg oxy for MRI imaging  Arnoldo Lenis, MD PGY-1, Resident

## 2020-07-20 NOTE — H&P (Addendum)
Family Medicine Teaching [facility] Admission History and Physical Service Pager: [phone]  Patient name: Chase Velez Medical record number: [id number] Date of birth: 12/26/1990 Age: 30 y.o. Gender: male  Primary Care Provider: Harold [facility]ban in [place], [place] Consultants: Psychiatry Code Status: Full Preferred Emergency Contact: Jyair Kiraly (Mom) [phone]  Chief Complaint: Back pain, bilateral lower extremity weakness  Assessment and Plan: Chase Velez is a 30 y.o. male presenting with back pain and bilateral lower extremity weakness. PMH is significant for chronic back pain s/p T6-L2 fusion, conversion disorder, urinary incontinence, HTN, and obesity.  Back Pain, Bilateral Lower Extremity Weakness Patient presents with 1 day of low back pain and bilateral lower extremity weakness. Also reports urinary retention since 10:30pm last night. Of note, he had T6-L2 fusion in Dec 2017 and follows with neurosurgery Aubery Lapping). He has a history of recurrent lower extremity weakness and urinary incontinence per PCP note on 07/18/2020. Has had multiple visits recently at [facility] and [facility] and has been diagnosed with conversion disorder after all imaging has been normal. In the ED, vitals are unremarkable other than mild HTN and labs are wnl. MRI of the thoracic and lumbar spine showed no acute findings. On exam he has 0/5 strength in bilateral lower extremities and reports no sensation below the hips bilaterally. Remainder of neuro exam is normal including normal rectal tone. Etiology most likely related to his conversion disorder, but should rule out C-spine abnormality before definitively concluding psychogenic nature. -Admit to FPTS, med-surg, attending Dr. Pollie Meyer -Vitals per unit routine -Will obtain MRI C-spine -Psychiatry consulted in ED, appreciate their recommendations -PT/OT eval and treat -Continue home Lyrica and Cymbalta, Valium TID prn -Tylenol 650mg  q6h prn for  pain -Patient reports oxycodone 10mg  TID as a home medication but PMP reviewed and last dispensed 5 day quantity on 06/01/20. Would avoid opioids for now. Received 4mg  morphine and 1mg  dilaudid in the ED.  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  -TTS consulted, appreciate their recommendations - Consider psychiatry  -Continue home Doxepin nightly for sleep -Continue home Cymbalta  Urinary Retention  Hx of Urinary Incontinence Patient reportedly alternates between urinary retention and incontinence at baseline. Will self-cath at home. Has been unable to urinate since 10:30pm last night so foley was placed in the ED. He follows with [facility] in [place], and has upcoming urodynamic studies in July. -Foley catheter in place -Monitor UOP -Likely void trial tomorrow am -Outpatient urology f/u  HTN Most recent BP 132/68. Was hypertensive to 150s and 160s systolic initially in the ED. Home meds: Losartan-HCTZ 100-12.5mg  daily -Continue home Losartan-HCTZ -Monitor BP  Obesity On Metformin 500mg  daily for weight loss. Most recent A1c wnl. PCP planning to start GLP-1 to help with weight loss. Pt lost ~70 lbs recently.  -Continue home Metformin  Acne Reportedly on Doxycycline 100mg  BID at home. -Hold home doxycycline  FEN/GI: Regular diet Prophylaxis: Lovenox  Disposition: med-surg  History of Present Illness:  Chase Velez is a 30 y.o. male presenting with back pain and lower extremity weakness.   Patient states his legs were weak upon awakening yesterday, but he was able to go about his day normally. Around 5pm his legs became numb and he fell. Did not sustain any injuries. This morning he developed severe back pain. States "I wasn't  able to take it". Also  became concerned because he was unable to urinate since 10:30pm last night. Patient reports he is unable to walk because his legs are weak and totally numb. He endorses chronic pain in his thoracic spine but states this is in his lower back which is different.  On chart review, patient has had similar symptoms over the past few years. Had ED visits at [facility] and [facility] in April and May 2022 with negative imaging/workups. Has been diagnosed with conversion disorder.  Review Of Systems: Per HPI   Patient Active Problem List   Diagnosis Date Noted  . Conversion disorder 07/20/2020    Past Medical History: Chronic Back Pain Urinary Incontinence HTN Obesity  Past Surgical History: Past Surgical History:  Procedure Laterality Date  . BACK SURGERY    Cholecystectomy 2011 Appendectomy 2015 Bilateral knee meniscus surgeries Thumb surgery for torn ligament  Social History: Social History   Tobacco Use  . Smoking status: Never Smoker  . Smokeless tobacco: Never Used  Substance Use Topics  . Alcohol use: Not Currently  . Drug use: Not Currently  Lives with his parents. Unemployed.  Family History: Dad: MI, CAD, HTN  Allergies and Medications: Allergies: Bactrim (eyes swollen shut), Lisinopril (throat closed), Latex No current facility-administered medications on file prior to encounter.   No current outpatient medications on file prior to encounter.  Current Meds per patient report: Lyrica 200mg  TID, Valium 5mg  TID, Oxycodone 10mg  TID, doxycycline 100mg  BID, Losartan-HCTZ 100-12.5mg  daily, Doxepin 3mg  nightly, Cymbalta 30mg  BID, Metformin 500mg  daily (for weight loss)  Objective: BP 130/67   Pulse 63   Temp 98.4 F (36.9 C) (Oral)   Resp 13   SpO2 97%  Exam: General: alert, well-appearing, resting comfortably in bed Eyes: PERRL, EOMI ENTM: oropharynx normal, moist mucus membranes, uvula midline Neck: supple, no cervical lymphadenopathy Cardiovascular: RRR,  normal S1/S2 without m/r/g Respiratory: normal effort, lungs CTA Gastrointestinal: obese, soft, nontender MSK: no peripheral edema Derm: erythema of bilateral lower extremities and upper extremities in sun-distribution, skin peeling skin on bilateral feet Neuro: CN II-XII intact. 5/5 strength in bilateral upper extremities. 0/5 in bilateral lower extremities. Sensation intact in upper extremities. No sensation below hips bilaterally. Normal finger to nose, normal rectal tone Psych: Appropriate affect, normal speech  Labs and Imaging: CBC BMET  Recent Labs  Lab 07/20/20 1155  WBC 8.7  HGB 14.6  HCT 44.3  PLT 303   Recent Labs  Lab 07/20/20 1155  NA 139  K 3.8  CL 101  CO2 28  BUN 9  CREATININE 0.87  GLUCOSE 100*  CALCIUM 9.4      DG THORACOLUMABAR SPINE Result Date: 07/20/2020 IMPRESSION: Prior T6-L2 fusion. Old appearing compression fractures of T10 and superior endplate T9. No acute osseous abnormalities. Electronically Signed   By: M.D.   On: 07/20/2020 13:17   MR THORACIC SPINE WO CONTRAST Result Date: 07/20/2020 IMPRESSION: No acute traumatic finding. Previous thoracolumbar fusion from T6-L2 has a satisfactory appearance. Mild facet and ligamentous degenerative changes and hypertrophy at T4-5 and T5-6, but without compressive encroachment upon the canal or foramina. Electronically Signed   By: M.D.   On: 07/20/2020 16:19   MR LUMBAR SPINE WO CONTRAST Result Date: 07/20/2020 IMPRESSION: Thoracolumbar fusion extends down as far as L2. The L1-2 level is unremarkable. No compressive stenosis from L2-3 through L5-S1. At L3-4, there is some adjacent segment facet degeneration and hypertrophy but without discernible edematous change  or compressive encroachment upon the neural spaces. Electronically Signed   By: Paulina Fusi M.D.   On: 07/20/2020 16:14     Maury Dus, MD 07/20/2020, 6:03 PM PGY-1, [facility] Family Medicine FPTS Intern pager:  [phone], text pages welcome  FPTS Upper-Level Resident Addendum   I have independently interviewed and examined the patient. I have discussed the above with the original author and agree with their documentation. My edits for correction/addition/clarification are within the note. Please see also any attending notes.   Katha Cabal, DO PGY-2, [facility] 07/20/2020 7:54 PM  FPTS Service pager: [phone] (text pages welcome through [facility])

## 2020-07-20 NOTE — Progress Notes (Signed)
Contrast not given to patient due to Exceeded SAR.  Pt received 6,624.66 J/kg.  It is a safety issue to continue at this time.

## 2020-07-20 NOTE — ED Notes (Signed)
RN attempted report x1.  

## 2020-07-20 NOTE — ED Provider Notes (Signed)
[facility] EMERGENCY DEPARTMENT Provider Note   CSN: [id number] Arrival date & time: 07/20/20  1017     History Chief Complaint  Patient presents with  . Back Pain    Chase Velez is a 30 y.o. male.  30 year old male with prior medical history as detailed below presents for evaluation.  Patient reports that he is visiting here from [place].  Patient reports a prior history of a T6-L2 spinal fusion that was performed in 2017 in [place].  Patient reports that yesterday morning he awoke with weakness and numbness to both lower extremities.  This worsened to the course of yesterday.  Around 5 PM yesterday he had a fall and then noted increased low back pain.  He reports at that time that his legs and groin felt numb.  This morning he reports that he was unable to urinate.  He reported increased weakness and numbness to both legs.  He was able to ambulate into the ED.  He reports decreased sensation from approximately L2 level down to his toes.  He is unable to lift his feet from the stretcher on initial exam.  He denies other recent trauma other than the fall from standing that occurred yesterday afternoon.  He other complaint or injury from that fall yesterday.  He denies recent fever.    The history is provided by the patient and medical records.  Back Pain Location:  Lumbar spine Quality:  Aching Pain severity:  Moderate Worse during: Worsened since yesterday. Onset quality:  Gradual Duration:  2 days Timing:  Constant Progression:  Worsening Chronicity:  New Associated symptoms: numbness, perianal numbness and weakness        History reviewed. No pertinent past medical history.  There are no problems to display for this patient.   Past Surgical History:  Procedure Laterality Date  . BACK SURGERY         No family history on file.  Social History   Tobacco Use  . Smoking status: Never Smoker  . Smokeless tobacco: Never Used   Substance Use Topics  . Alcohol use: Not Currently  . Drug use: Not Currently    Home Medications Prior to Admission medications   Not on File    Allergies    Patient has no known allergies.  Review of Systems   Review of Systems  Musculoskeletal: Positive for back pain.  Neurological: Positive for weakness and numbness.  All other systems reviewed and are negative.   Physical Exam Updated Vital Signs BP (!) 150/93   Pulse 99   Temp 98.4 F (36.9 C) (Oral)   Resp (!) 23   SpO2 98%   Physical Exam Vitals and nursing note reviewed.  Constitutional:      General: He is not in acute distress.    Appearance: He is well-developed.  HENT:     Head: Normocephalic and atraumatic.  Eyes:     Conjunctiva/sclera: Conjunctivae normal.     Pupils: Pupils are equal, round, and reactive to light.  Cardiovascular:     Rate and Rhythm: Normal rate and regular rhythm.     Heart sounds: Normal heart sounds.  Pulmonary:     Effort: Pulmonary effort is normal. No respiratory distress.     Breath sounds: Normal breath sounds.  Abdominal:     General: There is no distension.     Palpations: Abdomen is soft.     Tenderness: There is no abdominal tenderness.  Musculoskeletal:  General: No deformity. Normal range of motion.     Cervical back: Normal range of motion and neck supple.  Skin:    General: Skin is warm and dry.  Neurological:     Mental Status: He is alert and oriented to person, place, and time.     Comments: Reports decreased sensation from approx L1-2 to toes  No reported peri-rectal or groin sensation  Unable to lift BLE from stretcher     ED Results / Procedures / Treatments   Labs (all labs ordered are listed, but only abnormal results are displayed) Labs Reviewed  BASIC METABOLIC PANEL - Abnormal; Notable for the following components:      Result Value   Glucose, Bld 100 (*)    All other components within normal limits  RAPID URINE DRUG SCREEN,  HOSP PERFORMED - Abnormal; Notable for the following components:   Benzodiazepines POSITIVE (*)    All other components within normal limits  RESP PANEL BY RT-PCR (FLU A&B, COVID) ARPGX2  CBC WITH DIFFERENTIAL/PLATELET  URINALYSIS, ROUTINE W REFLEX MICROSCOPIC  TYPE AND SCREEN  ABO/RH    EKG None  Radiology DG THORACOLUMABAR SPINE  Result Date: 07/20/2020 CLINICAL DATA:  Larey Seat yesterday, legs came out, severe back pain, groin numbness, unable to urinate, history of back surgery EXAM: THORACOLUMBAR SPINE 1V COMPARISON:  None FINDINGS: Prior thoracolumbar fusion, T6-L2. Hardware appears intact. 5 non-rib-bearing lumbar vertebra. Compression fractures of T10 and superior endplates of T9 and T11, appear old. Remaining vertebral body heights maintained. No additional fracture,, subluxation, or bone destruction. SI joints preserved. IMPRESSION: Prior T6-L2 fusion. Old appearing compression fractures of T10 and superior endplate T9. No acute osseous abnormalities. Electronically Signed   By: Ulyses Southward M.D.   On: 07/20/2020 13:17   MR THORACIC SPINE WO CONTRAST  Result Date: 07/20/2020 CLINICAL DATA:  Back pain.  Fell.  Unable to urinate. EXAM: MRI THORACIC SPINE WITHOUT CONTRAST TECHNIQUE: Multiplanar, multisequence MR imaging of the thoracic spine was performed. No intravenous contrast was administered. COMPARISON:  Radiography earlier same day FINDINGS: Alignment:  No malalignment. Vertebrae: Thoracolumbar fusion procedure from T6-L2. Old compression fracture at T10 with loss of height of 50%. Mild loss of height also at T11 and T12. Cord:  No cord compression or primary cord lesion. Paraspinal and other soft tissues: No paravertebral abnormality identified. Disc levels: No disc space abnormality at T5-6 or above. Mild facet and ligamentous hypertrophy at T4-5 and T5-6 without encroachment upon the neural structures. This could contribute to upper thoracic back pain. The left-sided pedicle screw at T6  comes very close to the disc space, but there does not appear to be T5-6 disc degeneration. T6 through T12: Previous fusion procedure as above. Limited detail because of artifact from fusion hardware. No apparent acute or unexpected finding in that region. Apparent sufficient patency of the canal and foramina, allowing for the artifact. IMPRESSION: No acute traumatic finding. Previous thoracolumbar fusion from T6-L2 has a satisfactory appearance. Mild facet and ligamentous degenerative changes and hypertrophy at T4-5 and T5-6, but without compressive encroachment upon the canal or foramina. Electronically Signed   By: Paulina Fusi M.D.   On: 07/20/2020 16:19   MR LUMBAR SPINE WO CONTRAST  Result Date: 07/20/2020 CLINICAL DATA:  Larey Seat yesterday with leg weakness and back pain. Unable to urinate. EXAM: MRI LUMBAR SPINE WITHOUT CONTRAST TECHNIQUE: Multiplanar, multisequence MR imaging of the lumbar spine was performed. No intravenous contrast was administered. COMPARISON:  Radiography earlier same day FINDINGS: Segmentation:  5 lumbar type vertebral bodies. Alignment:  Normal Vertebrae: Thoracolumbar fusion extending as low as L2. No abnormality seen below that. Conus medullaris and cauda equina: Conus extends to the L1 level. Conus and cauda equina appear normal. Paraspinal and other soft tissues: Negative Disc levels: L1-2: Normal.  Solid fusion. L2-3: No disc abnormality. Mild facet degeneration and hypertrophy. No compressive stenosis. L3-4: No disc abnormality. Minimal facet and ligamentous prominence. No stenosis. L4-5: Minimal bulging of the disc. Minimal facet and ligamentous prominence. No compressive stenosis. L5-S1: Normal interspace. IMPRESSION: Thoracolumbar fusion extends down as far as L2. The L1-2 level is unremarkable. No compressive stenosis from L2-3 through L5-S1. At L3-4, there is some adjacent segment facet degeneration and hypertrophy but without discernible edematous change or compressive  encroachment upon the neural spaces. Electronically Signed   By: Paulina FusiMark  Shogry M.D.   On: 07/20/2020 16:14    Procedures Procedures   Medications Ordered in ED Medications  morphine 4 MG/ML injection 4 mg (has no administration in time range)    ED Course  I have reviewed the triage vital signs and the nursing notes.  Pertinent labs & imaging results that were available during my care of the patient were reviewed by me and considered in my medical decision making (see chart for details).    MDM Rules/Calculators/A&P                          MDM  MSE complete  Chase Velez was evaluated in Emergency Department on 07/20/2020 for the symptoms described in the history of present illness. He was evaluated in the context of the global COVID-19 pandemic, which necessitated consideration that the patient might be at risk for infection with the SARS-CoV-2 virus that causes COVID-19. Institutional protocols and algorithms that pertain to the evaluation of patients at risk for COVID-19 are in a state of rapid change based on information released by regulatory bodies including the CDC and federal and state organizations. These policies and algorithms were followed during the patient's care in the ED.  Patient presented with complaint of low back pain with resulting numbness and significant weakness of both lower extremities.  Initially patient was not forthcoming with [place].  MRI of the T and L-spine obtained today are without evidence of acute compression of the spine.  Other screening labs obtained are without significant abnormality.  Care Everywhere  Review reveals multiple prior evaluations for similar complaints.  His most recent visit was on 5/27 to a [facility] facility [place].  Patient presented then with complaint similar to today.  He reported ground-level fall with back pain and then weakness of both lower  extremities.  Work-up at that time was without evidence of cord abnormality.  Patient was seen by psychiatry.  Patient was considered to most likely have conversion disorder.  Eventually, patient was able to ambulate after ED evaluation and was discharged.  Case discussed today with Dr. Iver NestleBhagat covering the [facility] service.  MRI results from today discussed.  She agrees that additional MRI or neurologic work-up is not indicated at this time.  Patient with strong likelihood of recurrent conversion disorder.  Case discussed with family medicine teaching team.  Patient will be evaluated for admission.  TTS/psych consult initiated.   Patient does understand plan of care.  Final Clinical Impression(s) / ED Diagnoses Final diagnoses:  Low back pain without sciatica, unspecified back pain laterality, unspecified  chronicity  Weakness of both lower extremities    Rx / DC Orders ED Discharge Orders    None       Wynetta Fines, MD 07/20/20 1744

## 2020-07-20 NOTE — ED Notes (Signed)
RN paged Dr. For pain meds

## 2020-07-21 DIAGNOSIS — G8929 Other chronic pain: Secondary | ICD-10-CM | POA: Diagnosis present

## 2020-07-21 DIAGNOSIS — F449 Dissociative and conversion disorder, unspecified: Secondary | ICD-10-CM | POA: Diagnosis present

## 2020-07-21 DIAGNOSIS — R52 Pain, unspecified: Secondary | ICD-10-CM | POA: Diagnosis not present

## 2020-07-21 DIAGNOSIS — E669 Obesity, unspecified: Secondary | ICD-10-CM | POA: Diagnosis present

## 2020-07-21 DIAGNOSIS — N3946 Mixed incontinence: Secondary | ICD-10-CM | POA: Diagnosis present

## 2020-07-21 DIAGNOSIS — R262 Difficulty in walking, not elsewhere classified: Secondary | ICD-10-CM | POA: Diagnosis present

## 2020-07-21 DIAGNOSIS — Z7984 Long term (current) use of oral hypoglycemic drugs: Secondary | ICD-10-CM | POA: Diagnosis not present

## 2020-07-21 DIAGNOSIS — F419 Anxiety disorder, unspecified: Secondary | ICD-10-CM | POA: Diagnosis not present

## 2020-07-21 DIAGNOSIS — R339 Retention of urine, unspecified: Secondary | ICD-10-CM | POA: Diagnosis present

## 2020-07-21 DIAGNOSIS — Z8249 Family history of ischemic heart disease and other diseases of the circulatory system: Secondary | ICD-10-CM | POA: Diagnosis not present

## 2020-07-21 DIAGNOSIS — F411 Generalized anxiety disorder: Secondary | ICD-10-CM | POA: Diagnosis present

## 2020-07-21 DIAGNOSIS — R630 Anorexia: Secondary | ICD-10-CM | POA: Diagnosis present

## 2020-07-21 DIAGNOSIS — Z20822 Contact with and (suspected) exposure to covid-19: Secondary | ICD-10-CM | POA: Diagnosis present

## 2020-07-21 DIAGNOSIS — R29898 Other symptoms and signs involving the musculoskeletal system: Secondary | ICD-10-CM | POA: Diagnosis not present

## 2020-07-21 DIAGNOSIS — Z6841 Body Mass Index (BMI) 40.0 and over, adult: Secondary | ICD-10-CM | POA: Diagnosis not present

## 2020-07-21 DIAGNOSIS — I10 Essential (primary) hypertension: Secondary | ICD-10-CM | POA: Diagnosis present

## 2020-07-21 DIAGNOSIS — F329 Major depressive disorder, single episode, unspecified: Secondary | ICD-10-CM | POA: Diagnosis present

## 2020-07-21 DIAGNOSIS — Z981 Arthrodesis status: Secondary | ICD-10-CM | POA: Diagnosis not present

## 2020-07-21 LAB — MRSA PCR SCREENING: MRSA by PCR: NEGATIVE

## 2020-07-21 MED ORDER — LIDOCAINE 5 % EX PTCH
1.0000 | MEDICATED_PATCH | Freq: Every day | CUTANEOUS | Status: DC
  Administered 2020-07-21 – 2020-07-25 (×6): 1 via TRANSDERMAL
  Filled 2020-07-21 (×6): qty 1

## 2020-07-21 MED ORDER — IBUPROFEN 200 MG PO TABS
400.0000 mg | ORAL_TABLET | Freq: Four times a day (QID) | ORAL | Status: DC | PRN
  Administered 2020-07-21 – 2020-07-26 (×6): 400 mg via ORAL
  Filled 2020-07-21 (×6): qty 2

## 2020-07-21 MED ORDER — DICLOFENAC SODIUM 1 % EX GEL
4.0000 g | Freq: Three times a day (TID) | CUTANEOUS | Status: DC
  Administered 2020-07-21 – 2020-07-26 (×22): 4 g via TOPICAL
  Filled 2020-07-21: qty 100

## 2020-07-21 MED ORDER — CHLORHEXIDINE GLUCONATE CLOTH 2 % EX PADS
6.0000 | MEDICATED_PAD | Freq: Every day | CUTANEOUS | Status: DC
  Administered 2020-07-21 – 2020-07-26 (×6): 6 via TOPICAL

## 2020-07-21 MED ORDER — ACETAMINOPHEN 500 MG PO TABS
1000.0000 mg | ORAL_TABLET | Freq: Four times a day (QID) | ORAL | Status: DC | PRN
  Administered 2020-07-21 – 2020-07-26 (×6): 1000 mg via ORAL
  Filled 2020-07-21 (×6): qty 2

## 2020-07-21 NOTE — Progress Notes (Signed)
FPTS Interim Progress Note  Patient sleeping and resting comfortably.  Rounded with primary RN.  Had MRI C spine completed.  No concerns voiced.  No orders required.  Appreciated nightly round.  Today's Vitals   07/20/20 1845 07/20/20 1900 07/20/20 2050 07/20/20 2209  BP: 133/63 138/68 (!) 142/72   Pulse: (!) 50 64    Resp: 14 16    Temp:   98.4 F (36.9 C)   TempSrc:   Oral   SpO2: 100% 97%    PainSc:   8  7    MRI C Spine without spinal canal stenosis.  Mild right C3-4 and C4-5 foraminal stenosis due to uncovertebral spurring.  Will continue to monitor pain  Dana Allan, MD [facility] Medicine Residency

## 2020-07-21 NOTE — Progress Notes (Addendum)
Family Medicine Teaching Service Daily Progress Note Intern Pager: [phone]  Patient name: Chase Velez Medical record number: [id number] Date of birth: 1990/07/23 Age: 30 y.o. Gender: male  Primary Care Provider: Harold Velez Consultants: Psych Code Status: Full  Pt Overview and Major Events to Date:  6/5: admitted  Assessment and Plan:  Chase Velez is a 30 y.o. male presenting with back pain and bilateral lower extremity weakness. PMH is significant for chronic back pain s/p T6-L2 fusion, conversion disorder, urinary incontinence, HTN, and obesity.  Back Pain, Bilateral Lower Extremity Weakness Patient with persistent BLE weakness this morning. MRI lumbar, thoracic and cervical spine were negative for acute findings. 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  -PT/OT eval and treat -Tylenol 1g q6h prn -Voltaren gel prn -Ibuprofen 400mg  q6h prn -Continue home Lyrica, Cymbalta, Doxepin. 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 He [facility] and has upcoming urodynamic studies. -Void trial today -Outpatient urology follow-up  HTN Normotensive this morning. Most recent bp 117/68. -Continue home Losartan-HCTZ  Obesity -Continue home Metformin -PCP follow-up, reportedly planning to start GLP1   FEN/GI: Regular diet PPx: Lovenox   Status is: Observation The patient remains OBS appropriate and will d/c before 2 midnights.  Dispo: The patient is from: Home              Anticipated d/c is to: Home              Patient currently is not medically stable to d/c. Anticipate d/c home this afternoon if patient able  to ambulate.   Difficult to place patient No     Subjective:  No acute events overnight. Patient reports persistent weakness and numbness of bilateral lower extremities this morning. Agreeable to going home this afternoon and following up with his PCP/outpatient specialists if able to ambulate.  Objective: Temp:  [98.4 F (36.9 C)] 98.4 F (36.9 C) (06/06 0407) Pulse Rate:  [50-99] 68 (06/06 0407) Resp:  [9-23] 16 (06/06 0407) BP: (85-182)/(46-93) 115/69 (06/06 0407) SpO2:  [94 %-100 %] 95 % (06/06 0407) Physical Exam: General: alert, resting comfortably, NAD Cardiovascular: RRR, normal S1/S2 without m/r/g Respiratory: normal effort, lungs CTAB Abdomen: soft, nontender Neuro: CN II-XII intact. 5/5 strength in bilateral upper extremities. 1/5 strength in bilateral lower extremities. Absent sensation below bilateral hips per patient report.  Laboratory: Recent Labs  Lab 07/20/20 1155 07/20/20 2048  WBC 8.7 10.2  HGB 14.6 13.7  HCT 44.3 41.2  PLT 303 268   Recent Labs  Lab 07/20/20 1155 07/20/20 2048  NA 139  --   K 3.8  --   CL 101  --   CO2 28  --   BUN 9  --   CREATININE 0.87 0.83  CALCIUM 9.4  --   GLUCOSE 100*  --     Imaging/Diagnostic Tests: MR CERVICAL SPINE WO CONTRAST Result Date: 07/20/2020 IMPRESSION: 1. No cervical spinal canal stenosis. 2. Mild right C3-C4 and C4-C5 neural foraminal stenosis due to uncovertebral spurring.   MR THORACIC SPINE WO CONTRAST Result Date: 07/20/2020 IMPRESSION: No acute traumatic finding. Previous thoracolumbar fusion from T6-L2 has a satisfactory appearance. Mild facet and ligamentous degenerative changes and hypertrophy at T4-5 and T5-6, but without compressive encroachment upon the canal or  foramina.   MR LUMBAR SPINE WO CONTRAST Result Date: 07/20/2020 IMPRESSION: Thoracolumbar fusion extends down as far as L2. The L1-2 level is unremarkable. No compressive stenosis from L2-3 through L5-S1. At L3-4, there is some adjacent  segment facet degeneration and hypertrophy but without discernible edematous change or compressive encroachment upon the neural spaces.     Maury Dus, MD 07/21/2020, 9:01 AM PGY-1, [facility] Family Medicine FPTS Intern pager: [phone], text pages welcome

## 2020-07-21 NOTE — Evaluation (Addendum)
Physical Therapy Evaluation Patient Details Name: Chase Velez MRN: [id number] DOB: [birth year] Today's Date: 07/21/2020   History of Present Illness  Pt is a 30 y.o. M who presents wtih back pain and BLE weakness. PMH: chronic back pain s/p T6-L2 fusion, conversion disorder, urinary incontinence, HTN, obesity.  Clinical Impression  Prior to admission, pt lives with his parents in [place] and is independent. Pt presents with decreased functional mobility secondary to gross bilateral lower extremity weakness and decreased sensation, decreased sitting balance, and low back pain. Noted 0/5 left lower extremity strength and 1/5 right lower extremity strength; however, pt able to hold against gravity seated edge of bed. Pt requiring two person moderate assist for bed mobility and was unable to stand from edge of bed after multiple attempts with bilateral knee block. Pt reports a similar episode happened several years ago for which he required rehabilitation. Recommending SNF to address deficits and maximize functional independence.     Follow Up Recommendations SNF    Equipment Recommendations  Drop arm 3in1 (PT); Bariatric Wheelchair (measurements PT);Wheelchair cushion (measurements PT);Other (comment) (slide board)    Recommendations for Other Services       Precautions / Restrictions Precautions Precautions: Fall Restrictions Weight Bearing Restrictions: No      Mobility  Bed Mobility Overal bed mobility: Needs Assistance Bed Mobility: Supine to Sit;Sit to Supine     Supine to sit: Mod assist;+2 for physical assistance Sit to supine: Mod assist;+2 for physical assistance   General bed mobility comments: Heavy modA for BLE assist into and out of bed, pt able to functionally use arms and push up off the railing, but ultimately needed assist to bring trunk completely to upright    Transfers Overall transfer level: Needs assistance Equipment used: None Transfers: Sit to/from  Stand Sit to Stand: Max assist;+2 physical assistance         General transfer comment: Heavy two person maximal assist to stand x 2 from edge of bed with bilateral knee blocked. Pt attempting to use arms to push off. Was only able to achieve ~50% of upright  Ambulation/Gait             General Gait Details: unable  Stairs            Wheelchair Mobility    Modified Rankin (Stroke Patients Only)       Balance Overall balance assessment: Needs assistance Sitting-balance support: Feet supported;Bilateral upper extremity supported Sitting balance-Leahy Scale: Poor Sitting balance - Comments: Reliant on BUE support, supervision for safety                                     Pertinent Vitals/Pain Pain Assessment: Faces Faces Pain Scale: Hurts a little bit Pain Location: low back Pain Descriptors / Indicators: Grimacing Pain Intervention(s): Monitored during session    Home Living Family/patient expects to be discharged to:: Private residence Living Arrangements: Parent Available Help at Discharge: Family Type of Home: House Home Access: Stairs to enter Entrance Stairs-Rails: Right Entrance Stairs-Number of Steps: 2 Home Layout: One level Home Equipment: Emergency planning/management officer - 2 wheels      Prior Function Level of Independence: Independent         Comments: Does not work, self caths     Higher education careers adviser        Extremity/Trunk Assessment   Upper Extremity Assessment Upper Extremity Assessment: Overall WFL for tasks assessed  Lower Extremity Assessment Lower Extremity Assessment: RLE deficits/detail;LLE deficits/detail RLE Deficits / Details: 1/5 strength overall RLE Sensation: decreased light touch LLE Deficits / Details: 0/5 strength LLE Sensation: decreased light touch       Communication   Communication: No difficulties  Cognition Arousal/Alertness: Awake/alert Behavior During Therapy: Flat affect Overall Cognitive  Status: Within Functional Limits for tasks assessed                                        General Comments      Exercises General Exercises - Lower Extremity Long Arc QuadBarbaraann Boys;Both;10 reps;Seated Other Exercises Other Exercises: Seated: manual bilateral gastroc stretch x 1 minute each   Assessment/Plan    PT Assessment Patient needs continued PT services  PT Problem List Decreased strength;Decreased activity tolerance;Decreased balance;Decreased mobility;Obesity;Pain;Impaired sensation       PT Treatment Interventions DME instruction;Gait training;Functional mobility training;Therapeutic exercise;Therapeutic activities;Balance training;Patient/family education;Wheelchair mobility training    PT Goals (Current goals can be found in the Care Plan section)  Acute Rehab PT Goals Patient Stated Goal: regain strength PT Goal Formulation: With patient Time For Goal Achievement: 08/04/20 Potential to Achieve Goals: Good    Frequency Min 3X/week   Barriers to discharge        Co-evaluation PT/OT/SLP Co-Evaluation/Treatment: Yes Reason for Co-Treatment: For patient/therapist safety;To address functional/ADL transfers PT goals addressed during session: Mobility/safety with mobility;Strengthening/ROM         AM-PAC PT "6 Clicks" Mobility  Outcome Measure Help needed turning from your back to your side while in a flat bed without using bedrails?: A Lot Help needed moving from lying on your back to sitting on the side of a flat bed without using bedrails?: Total Help needed moving to and from a bed to a chair (including a wheelchair)?: Total Help needed standing up from a chair using your arms (e.g., wheelchair or bedside chair)?: Total Help needed to walk in hospital room?: Total Help needed climbing 3-5 steps with a railing? : Total 6 Click Score: 7    End of Session Equipment Utilized During Treatment: Gait belt Activity Tolerance: Patient tolerated  treatment well Patient left: in bed;with call bell/phone within reach Nurse Communication: Mobility status PT Visit Diagnosis: Muscle weakness (generalized) (M62.81);Difficulty in walking, not elsewhere classified (R26.2)    Time: 9147-8295 PT Time Calculation (min) (ACUTE ONLY): 37 min   Charges:   PT Evaluation $PT Eval Moderate Complexity: 1 Mod          Lillia Pauls, PT, DPT Acute Rehabilitation Services Pager [phone] Office [phone]   Norval Morton 07/21/2020, 10:00 AM

## 2020-07-21 NOTE — Evaluation (Signed)
Occupational Therapy Evaluation Patient Details Name: Chase Velez MRN: [id number] DOB: Feb 09, 1991 Today's Date: 07/21/2020    History of Present Illness Pt is a 30 y.o. M who presents wtih back pain and BLE weakness. PMH: chronic back pain s/p T6-L2 fusion, conversion disorder, urinary incontinence, HTN, obesity.   Clinical Impression   Pt PTA: Pt living independently, driving, not working. Pt currently, limited by decreased strength, decreased ability to care for self and decreased activity tolerance. Pt with conversion d/o exhibiting strong BUEs, but unable to move BLEs despite max effort. ModA+2 for bed mobility and maxA +2 for standing trials, unable to reach standing. Pt would benefit from continued OT skilled services. OT following acutely.    Follow Up Recommendations  SNF;Supervision/Assistance - 24 hour    Equipment Recommendations  None recommended by OT    Recommendations for Other Services       Precautions / Restrictions Precautions Precautions: Fall Restrictions Weight Bearing Restrictions: No      Mobility Bed Mobility Overal bed mobility: Needs Assistance Bed Mobility: Supine to Sit;Sit to Supine     Supine to sit: Mod assist;+2 for physical assistance Sit to supine: Mod assist;+2 for physical assistance   General bed mobility comments: Heavy modA for BLE assist into and out of bed, pt able to functionally use arms and push up off the railing, but ultimately needed assist to bring trunk completely to upright    Transfers Overall transfer level: Needs assistance Equipment used: None Transfers: Sit to/from Stand Sit to Stand: Max assist;+2 physical assistance         General transfer comment: Heavy two person maximal assist to stand x 2 from edge of bed with bilateral knee blocked. Pt attempting to use arms to push off. Was only able to achieve ~50% of upright    Balance Overall balance assessment: Needs assistance Sitting-balance support: Feet  supported;Bilateral upper extremity supported Sitting balance-Leahy Scale: Poor Sitting balance - Comments: Reliant on BUE support, supervision for safety                                   ADL either performed or assessed with clinical judgement   ADL Overall ADL's : Needs assistance/impaired Eating/Feeding: Set up;Bed level   Grooming: Set up;Sitting;Bed level   Upper Body Bathing: Set up;Sitting   Lower Body Bathing: Maximal assistance   Upper Body Dressing : Set up;Sitting;Bed level   Lower Body Dressing: Maximal assistance;Bed level;Sitting/lateral leans;+2 for physical assistance;+2 for safety/equipment;Total assistance   Toilet Transfer: Total assistance;+2 for physical assistance;+2 for safety/equipment Toilet Transfer Details (indicate cue type and reason): unable to find stedy for transfer to recliner; next time hopeful for sliding board Toileting- Clothing Manipulation and Hygiene: Total assistance       Functional mobility during ADLs: Moderate assistance;Maximal assistance (modA +2 for bed mobility and maxA +2 for attempts for sit to stand) General ADL Comments: Pt limited by decreased strength, decreased ability to care for self and decreased activity tolerance. Pt with strong BUEs, but unable to move BLEs despite max effort.     Vision Baseline Vision/History: Wears glasses Wears Glasses: At all times Patient Visual Report: No change from baseline Vision Assessment?: No apparent visual deficits     Perception     Praxis      Pertinent Vitals/Pain Pain Assessment: Faces Faces Pain Scale: Hurts a little bit Pain Location: low back Pain Descriptors / Indicators: Grimacing Pain  Intervention(s): Monitored during session;Repositioned;Premedicated before session     Hand Dominance Right   Extremity/Trunk Assessment Upper Extremity Assessment Upper Extremity Assessment: Overall WFL for tasks assessed   Lower Extremity Assessment Lower  Extremity Assessment: RLE deficits/detail RLE Deficits / Details: 1/5 strength overall; muscles firing/pulsing at EOB RLE Sensation: decreased light touch LLE Deficits / Details: 0/5 strength LLE Sensation: decreased light touch       Communication Communication Communication: No difficulties   Cognition Arousal/Alertness: Awake/alert Behavior During Therapy: Flat affect Overall Cognitive Status: Within Functional Limits for tasks assessed                                     General Comments  VSS.    Exercises Exercises: General Lower Extremity;Other exercises General Exercises - Lower Extremity Long Arc Quad: AAROM;Both;10 reps;Seated Other Exercises Other Exercises: Seated: manual bilateral gastroc stretch x 1 minute each   Shoulder Instructions      Home Living Family/patient expects to be discharged to:: Private residence Living Arrangements: Parent Available Help at Discharge: Family Type of Home: House Home Access: Stairs to enter Secretary/administrator of Steps: 2 Entrance Stairs-Rails: Right Home Layout: One level     Bathroom Shower/Tub: Tub/shower unit         Home Equipment: Emergency planning/management officer - 2 wheels          Prior Functioning/Environment Level of Independence: Independent        Comments: Does not work, self caths        OT Problem List: Decreased strength;Decreased activity tolerance;Impaired balance (sitting and/or standing);Decreased safety awareness;Pain;Increased edema      OT Treatment/Interventions: Self-care/ADL training;Therapeutic exercise;Energy conservation;DME and/or AE instruction;Therapeutic activities;Visual/perceptual remediation/compensation;Cognitive remediation/compensation;Patient/family education;Balance training;Neuromuscular education    OT Goals(Current goals can be found in the care plan section) Acute Rehab OT Goals Patient Stated Goal: regain strength OT Goal Formulation: With patient Time  For Goal Achievement: 08/04/20 Potential to Achieve Goals: Good ADL Goals Pt Will Perform Lower Body Dressing: with min assist;sitting/lateral leans;sit to/from stand;with adaptive equipment Pt Will Transfer to Toilet: with min assist;with transfer board;bedside commode Pt Will Perform Toileting - Clothing Manipulation and hygiene: with mod assist;sitting/lateral leans;sit to/from stand;with adaptive equipment Additional ADL Goal #1: Pt will perform bed mobility with minA as precursor for ADL. Additional ADL Goal #2: Pt will perform seated ADL tasks x10 mins with modified independence to increase activity tolerance.  OT Frequency: Min 2X/week   Barriers to D/C:            Co-evaluation PT/OT/SLP Co-Evaluation/Treatment: Yes Reason for Co-Treatment: Complexity of the patient's impairments (multi-system involvement);To address functional/ADL transfers PT goals addressed during session: Mobility/safety with mobility;Strengthening/ROM OT goals addressed during session: Strengthening/ROM      AM-PAC OT "6 Clicks" Daily Activity     Outcome Measure Help from another person eating meals?: None Help from another person taking care of personal grooming?: A Little Help from another person toileting, which includes using toliet, bedpan, or urinal?: Total Help from another person bathing (including washing, rinsing, drying)?: A Lot Help from another person to put on and taking off regular upper body clothing?: A Little Help from another person to put on and taking off regular lower body clothing?: Total 6 Click Score: 14   End of Session Equipment Utilized During Treatment: Gait belt Nurse Communication: Mobility status  Activity Tolerance: Patient tolerated treatment well Patient left: in bed;with call bell/phone within  reach;with bed alarm set  OT Visit Diagnosis: Unsteadiness on feet (R26.81);Muscle weakness (generalized) (M62.81)                Time: 8675-4492 OT Time Calculation  (min): 34 min Charges:  OT General Charges $OT Visit: 1 Visit OT Evaluation $OT Eval Moderate Complexity: 1 Mod  Flora Lipps, OTR/L Acute Rehabilitation Services Pager: [phone] Office: [phone]   Demorris Choyce C 07/21/2020, 10:26 AM

## 2020-07-21 NOTE — Consult Note (Signed)
[facility] Face-to-Face Psychiatry Consult   Reason for Consult:  Conversion Disorder Referring Physician:  Dr. Pollie Meyer Patient Identification: Chase Velez MRN:  [id number] Principal Diagnosis: Conversion disorder Diagnosis:  Active Problems:   Conversion disorder   Total Time spent with patient: 30 minutes  Subjective:   Chase Velez is a 30 y.o. male patient admitted with conversion disorder, low back pain, lower extremity weakness. Psych consult placed for conversion disorder, as MRI and all other studies have been determined to be within normal at this time. Patient is seen and assessed, he is alert and oriented x 3. He is calm and cooperative, pleasant and shows willingness to engage. Patient reports previous discussion of conversion disorder by his surgeon however his PCP did not feel as though he had conversion disorder. Patient continues to believe that his symptoms are real and "currently unable to walk because of it. " He states he has been participating with physical therapy in hopes to gain his strength. With the exception of walking he denies any limitations to completing his ADLs. He denies any previous psychiatric history,m and currently denies any depressive symptoms. He denies any manic symptoms. He denies any suicidal ideations, homicidal ideations, and or hallucinations. He does appear to be amendable to outpatient therapy. He seems to understand that his condition will improve with physical therapy and talk therapy. Patient appears to be stable at this time from a psychiatric standpoint and can receive outpatient therapeutic services.   HPI:  Patient presents with 1 day of low back pain and bilateral lower extremity weakness. Also reports urinary retention since 10:30pm last night. Of note, he had T6-L2 fusion in Dec 2017 and follows with neurosurgery Aubery Lapping). He has a history of recurrent lower extremity weakness and urinary incontinence per PCP note on 07/18/2020. Has had  multiple visits recently at [facility] and [facility] and has been diagnosed with conversion disorder after all imaging has been normal. In the ED, vitals are unremarkable other than mild HTN and labs are wnl. MRI of the thoracic and lumbar spine showed no acute findings. On exam he has 0/5 strength in bilateral lower extremities and reports no sensation below the hips bilaterally. Remainder of neuro exam is normal including normal rectal tone. Etiology most likely related to his conversion disorder, but should rule out C-spine abnormality before definitively concluding psychogenic nature.  Past Psychiatric History: Conversion  Risk to Self:  Denies Risk to Others:  Denies Prior Inpatient Therapy:   Denies Prior Outpatient Therapy:   Denies  Past Medical History: History reviewed. No pertinent past medical history.  Past Surgical History:  Procedure Laterality Date  . BACK SURGERY     Family History: No family history on file. Family Psychiatric  History:Denies Social History:  Social History   Substance and Sexual Activity  Alcohol Use Not Currently     Social History   Substance and Sexual Activity  Drug Use Not Currently    Social History   Socioeconomic History  . Marital status: Single    Spouse name: Not on file  . Number of children: Not on file  . Years of education: Not on file  . Highest education level: Not on file  Occupational History  . Not on file  Tobacco Use  . Smoking status: Never Smoker  . Smokeless tobacco: Never Used  Substance and Sexual Activity  . Alcohol use: Not Currently  . Drug use: Not Currently  . Sexual activity: Not on file  Other Topics Concern  .  Not on file  Social History Narrative  . Not on file   Social Determinants of Health   Financial Resource Strain: Not on file  Food Insecurity: Not on file  Transportation Needs: Not on file  Physical Activity: Not on file  Stress: Not on file  Social Connections: Not on file   Additional  Social History:    Allergies:  No Known Allergies  Labs:  Results for orders placed or performed during the hospital encounter of 07/20/20 (from the past 48 hour(s))  Basic metabolic panel     Status: Abnormal   Collection Time: 07/20/20 11:55 AM  Result Value Ref Range   Sodium 139 135 - 145 mmol/L   Potassium 3.8 3.5 - 5.1 mmol/L   Chloride 101 98 - 111 mmol/L   CO2 28 22 - 32 mmol/L   Glucose, Bld 100 (H) 70 - 99 mg/dL    Comment: Glucose reference range applies only to samples taken after fasting for at least 8 hours.   BUN 9 6 - 20 mg/dL   Creatinine, Ser 3.53 0.61 - 1.24 mg/dL   Calcium 9.4 8.9 - 61.4 mg/dL   GFR, Estimated >43 >15 mL/min    Comment: (NOTE) Calculated using the CKD-EPI Creatinine Equation (2021)    Anion gap 10 5 - 15    Comment: Performed at [facility], [address], [place], [place] [address]  CBC with Differential     Status: None   Collection Time: 07/20/20 11:55 AM  Result Value Ref Range   WBC 8.7 4.0 - 10.5 K/uL   RBC 4.88 4.22 - 5.81 MIL/uL   Hemoglobin 14.6 13.0 - 17.0 g/dL   HCT 76.1 95.0 - 93.2 %   MCV 90.8 80.0 - 100.0 fL   MCH 29.9 26.0 - 34.0 pg   MCHC 33.0 30.0 - 36.0 g/dL   RDW 67.1 24.5 - 80.9 %   Platelets 303 150 - 400 K/uL   nRBC 0.0 0.0 - 0.2 %   Neutrophils Relative % 66 %   Neutro Abs 5.8 1.7 - 7.7 K/uL   Lymphocytes Relative 26 %   Lymphs Abs 2.3 0.7 - 4.0 K/uL   Monocytes Relative 7 %   Monocytes Absolute 0.6 0.1 - 1.0 K/uL   Eosinophils Relative 0 %   Eosinophils Absolute 0.0 0.0 - 0.5 K/uL   Basophils Relative 1 %   Basophils Absolute 0.0 0.0 - 0.1 K/uL   Immature Granulocytes 0 %   Abs Immature Granulocytes 0.02 0.00 - 0.07 K/uL    Comment: Performed at [facility], [address] [place], [place] [address]  Urine rapid drug screen (hosp performed)     Status: Abnormal   Collection Time: 07/20/20 11:56 AM  Result Value Ref Range   Opiates NONE DETECTED NONE DETECTED   Cocaine NONE DETECTED NONE  DETECTED   Benzodiazepines POSITIVE (A) NONE DETECTED   Amphetamines NONE DETECTED NONE DETECTED   Tetrahydrocannabinol NONE DETECTED NONE DETECTED   Barbiturates NONE DETECTED NONE DETECTED    Comment: (NOTE) DRUG SCREEN FOR MEDICAL PURPOSES ONLY.  IF CONFIRMATION IS NEEDED FOR ANY PURPOSE, NOTIFY LAB WITHIN 5 DAYS.  LOWEST DETECTABLE LIMITS FOR URINE DRUG SCREEN Drug Class                     Cutoff (ng/mL) Amphetamine and metabolites    1000 Barbiturate and metabolites    200 Benzodiazepine  200 Tricyclics and metabolites     300 Opiates and metabolites        300 Cocaine and metabolites        300 THC                            50 Performed at [facility], [address], [place], [place] [address]   Resp Panel by RT-PCR (Flu A&B, Covid) Nasopharyngeal Swab     Status: None   Collection Time: 07/20/20 12:14 PM   Specimen: Nasopharyngeal Swab; Nasopharyngeal(NP) swabs in vial transport medium  Result Value Ref Range   SARS Coronavirus 2 by RT PCR NEGATIVE NEGATIVE    Comment: (NOTE) SARS-CoV-2 target nucleic acids are NOT DETECTED.  The SARS-CoV-2 RNA is generally detectable in upper respiratory specimens during the acute phase of infection. The lowest concentration of SARS-CoV-2 viral copies this assay can detect is 138 copies/mL. A negative result does not preclude SARS-Cov-2 infection and should not be used as the sole basis for treatment or other patient management decisions. A negative result may occur with  improper specimen collection/handling, submission of specimen other than nasopharyngeal swab, presence of viral mutation(s) within the areas targeted by this assay, and inadequate number of viral copies(<138 copies/mL). A negative result must be combined with clinical observations, patient history, and epidemiological information. The expected result is Negative.  Fact Sheet for Patients:   BloggerCourse.com  Fact Sheet for Healthcare Providers:  SeriousBroker.it  This test is no t yet approved or cleared by the [place] FDA and  has been authorized for detection and/or diagnosis of SARS-CoV-2 by FDA under an Emergency Use Authorization (EUA). This EUA will remain  in effect (meaning this test can be used) for the duration of the COVID-19 declaration under Section 564(b)(1) of the Act, 21 U.S.C.section 360bbb-3(b)(1), unless the authorization is terminated  or revoked sooner.       Influenza A by PCR NEGATIVE NEGATIVE   Influenza B by PCR NEGATIVE NEGATIVE    Comment: (NOTE) The Xpert Xpress SARS-CoV-2/FLU/RSV plus assay is intended as an aid in the diagnosis of influenza from Nasopharyngeal swab specimens and should not be used as a sole basis for treatment. Nasal washings and aspirates are unacceptable for Xpert Xpress SARS-CoV-2/FLU/RSV testing.  Fact Sheet for Patients: BloggerCourse.com  Fact Sheet for Healthcare Providers: SeriousBroker.it  This test is not yet approved or cleared by the [place] FDA and has been authorized for detection and/or diagnosis of SARS-CoV-2 by FDA under an Emergency Use Authorization (EUA). This EUA will remain in effect (meaning this test can be used) for the duration of the COVID-19 declaration under Section 564(b)(1) of the Act, 21 U.S.C. section 360bbb-3(b)(1), unless the authorization is terminated or revoked.  Performed at [facility], [address], [place], [place] [address]   Type and screen [facility]     Status: None   Collection Time: 07/20/20  1:50 PM  Result Value Ref Range   ABO/RH(D) A POS    Antibody Screen NEG    Sample Expiration      07/23/2020,2359 Performed at [facility], [address], [place], [place] [address]   ABO/Rh     Status: None   Collection  Time: 07/20/20  8:48 PM  Result Value Ref Range   ABO/RH(D)      A POS Performed at [facility], [address]  [place], [place] [address]   HIV Antibody (routine testing w rflx)     Status: None   Collection Time: 07/20/20  8:48 PM  Result Value Ref Range   HIV Screen 4th Generation wRfx Non Reactive Non Reactive    Comment: Performed at [facility], [address] [place], [place] [address]  CBC     Status: None   Collection Time: 07/20/20  8:48 PM  Result Value Ref Range   WBC 10.2 4.0 - 10.5 K/uL   RBC 4.56 4.22 - 5.81 MIL/uL   Hemoglobin 13.7 13.0 - 17.0 g/dL   HCT 82.941.2 56.239.0 - 13.052.0 %   MCV 90.4 80.0 - 100.0 fL   MCH 30.0 26.0 - 34.0 pg   MCHC 33.3 30.0 - 36.0 g/dL   RDW 86.513.5 78.411.5 - 69.615.5 %   Platelets 268 150 - 400 K/uL   nRBC 0.0 0.0 - 0.2 %    Comment: Performed at [facility], [address] [place], [place] [address]  Creatinine, serum     Status: None   Collection Time: 07/20/20  8:48 PM  Result Value Ref Range   Creatinine, Ser 0.83 0.61 - 1.24 mg/dL   GFR, Estimated >41>60 >32>60 mL/min    Comment: (NOTE) Calculated using the CKD-EPI Creatinine Equation (2021) Performed at [facility], [address] [place], [place] [address]   MRSA PCR Screening     Status: None   Collection Time: 07/20/20 10:32 PM   Specimen: Nasal Mucosa; Nasopharyngeal  Result Value Ref Range   MRSA by PCR NEGATIVE NEGATIVE    Comment:        The GeneXpert MRSA Assay (FDA approved for NASAL specimens only), is one component of a comprehensive MRSA colonization surveillance program. It is not intended to diagnose MRSA infection nor to guide or monitor treatment for MRSA infections. Performed at [facility], [address] [place], [place] [address]     Current Facility-Administered Medications  Medication Dose Route Frequency Provider Last Rate Last Admin  . acetaminophen (TYLENOL) tablet 1,000 mg  1,000 mg Oral Q6H PRN Dana AllanWalsh, Tanya, MD   1,000  mg at 07/21/20 0143  . Chlorhexidine Gluconate Cloth 2 % PADS 6 each  6 each Topical Daily Latrelle DodrillMcIntyre, Brittany J, MD      . diazepam (VALIUM) tablet 5 mg  5 mg Oral TID PRN Maury DusWells, Ashleigh, MD   5 mg at 07/20/20 2123  . diclofenac Sodium (VOLTAREN) 1 % topical gel 4 g  4 g Topical TID AC & HS Dana AllanWalsh, Tanya, MD   4 g at 07/21/20 0145  . doxepin (SINEQUAN) 10 MG/ML solution 3 mg  3 mg Oral QHS Maury DusWells, Ashleigh, MD   3 mg at 07/21/20 0013  . DULoxetine (CYMBALTA) DR capsule 30 mg  30 mg Oral BID Maury DusWells, Ashleigh, MD   30 mg at 07/20/20 2123  . enoxaparin (LOVENOX) injection 40 mg  40 mg Subcutaneous Q24H Maury DusWells, Ashleigh, MD   40 mg at 07/20/20 2123  . ibuprofen (ADVIL) tablet 400 mg  400 mg Oral Q6H PRN Dana AllanWalsh, Tanya, MD   400 mg at 07/21/20 0231  . lidocaine (LIDODERM) 5 % 1 patch  1 patch Transdermal QHS Dana AllanWalsh, Tanya, MD   1 patch at 07/21/20 0144  . metFORMIN (GLUCOPHAGE-XR) 24 hr tablet 500 mg  500 mg Oral Q breakfast Maury DusWells, Ashleigh, MD      . pregabalin (LYRICA) capsule 200 mg  200 mg Oral  TID Maury Dus, MD   200 mg at 07/20/20 2123    Musculoskeletal: Strength & Muscle Tone: UTA Gait & Station: UTA Patient leans: UTA            Psychiatric Specialty Exam:  Presentation  General Appearance: Appropriate for Environment; Casual  Eye Contact:Good  Speech:Clear and Coherent; Normal Rate  Speech Volume:Normal  Handedness:Right   Mood and Affect  Mood:Euthymic  Affect:Appropriate; Congruent   Thought Process  Thought Processes:Coherent; Linear  Descriptions of Associations:Intact  Orientation:Full (Time, Place and Person)  Thought Content:WDL  History of Schizophrenia/Schizoaffective disorder:No data recorded Duration of Psychotic Symptoms:No data recorded Hallucinations:Hallucinations: None  Ideas of Reference:None  Suicidal Thoughts:Suicidal Thoughts: No  Homicidal Thoughts:Homicidal Thoughts: No   Sensorium  Memory:Immediate Good; Remote Good;  Recent Good  Judgment:Good  Insight:Good   Executive Functions  Concentration:Good  Attention Span:Good  Recall:Good  Fund of Knowledge:Good  Language:Good   Psychomotor Activity  Psychomotor Activity:Psychomotor Activity: Normal   Assets  Assets:Communication Skills; Desire for Improvement; Financial Resources/Insurance; Social Support; Physical Health; Leisure Time   Sleep  Sleep:Sleep: Good   Physical Exam: Physical Exam ROS Blood pressure 117/68, pulse 68, temperature 98.4 F (36.9 C), temperature source Oral, resp. rate 15, SpO2 96 %. There is no height or weight on file to calculate BMI.   30 year old male who presents with bilateral lower extremity weakness, conversion disorder, and urinary retention. Neurological workup thus far has been negative, and medical team suspects his condition is likely psychogenic in nature. Patient has no other psychiatric conditions, denies any recent triggers or events that could contribute to his exacerbation of symptoms. He currently denies any psychiatric symptoms, and does not appear to be a threat to himself or others at this time. He is psychiatrically cleared.    Treatment Plan Summary: Plan Recommend outpatient behavioral therapy for mangement of conversion disorder. Strongly encourage physical therapy to help with rehabilitation in the interim. COntinue home medications at this time to manage his nueropathic pain, and also can be used to treat depressive symptoms.  Discussed with patient that conversion disorder is managed in an outpatient setting. He will require intensive outpatient therapy, and will benefit from [facility] referral.   Disposition: No evidence of imminent risk to self or others at present.   Patient does not meet criteria for psychiatric inpatient admission. Supportive therapy provided about ongoing stressors. Refer to IOP.  Psychiatry to sign off at this time.  Maryagnes Amos, FNP 07/21/2020 9:50  AM

## 2020-07-21 NOTE — TOC Initial Note (Signed)
Transition of Care [facility]) - Initial/Assessment Note    Patient Details  Name: Chase Velez MRN: [id number] Date of Birth: 05/15/1990  Transition of Care [facility]) CM/SW Contact:    Vinie Sill, LCSW Phone Number: 07/21/2020, 4:32 PM  Clinical Narrative:                  CSW met with patient at bedside. CSW introduced self and explained role. CSW discussed therapy recommendation of short term rehab at [facility]. Patient states he lives with his parents but was visiting here with his sister Reggy [facility]. Patient states he is agreeable to short term rehab. CSW explained the SNF process and possible barriers to SNF placement (age,insurance,etc),patient states understanding. CSW encourage the patient to explore other options if unable to discharge to SNF. CSW encourage patient to continue to work with PT and hopefully he can return home. Patient agreeable. Patient states no income and no Armed forces logistics/support/administrative officer.  Patient gave CSW permission to call his sister,Kaylan- CSW left voice message to return call.   CSW will continue to follow and assist with discharge planning.  Thurmond Butts, MSW, LCSW Clinical Social Worker      Barriers to Discharge: Inadequate or no insurance,Continued Medical Work up,SNF Pending bed offer   Patient Goals and CMS Choice        Expected Discharge Plan and Services   In-house Referral: Clinical Social Work     Living arrangements for the past 2 months: Single Family Home                                      Prior Living Arrangements/Services Living arrangements for the past 2 months: Single Family Home Lives with:: Self,Parents Patient language and need for interpreter reviewed:: No        Need for Family Participation in Patient Care: Yes (Comment) Care giver support system in place?: Yes (comment)   Criminal Activity/Legal Involvement Pertinent to Current Situation/Hospitalization: No - Comment as needed  Activities of Daily Living Home Assistive  Devices/Equipment: None ADL Screening (condition at time of admission) Patient's cognitive ability adequate to safely complete daily activities?: Yes Is the patient deaf or have difficulty hearing?: No Does the patient have difficulty seeing, even when wearing glasses/contacts?: No Does the patient have difficulty concentrating, remembering, or making decisions?: No Patient able to express need for assistance with ADLs?: Yes Does the patient have difficulty dressing or bathing?: No Independently performs ADLs?: Yes (appropriate for developmental age) Does the patient have difficulty walking or climbing stairs?: Yes Weakness of Legs: Both Weakness of Arms/Hands: Left  Permission Sought/Granted Permission sought to share information with : Family Supports Permission granted to share information with : Yes, Verbal Permission Granted  Share Information with NAME: Reeves Forth  Permission granted to share info w AGENCY: SNFs  Permission granted to share info w Relationship: sister  Permission granted to share info w Contact Information: [phone]  Emotional Assessment Appearance:: Appears stated age Attitude/Demeanor/Rapport: Engaged Affect (typically observed): Accepting,Pleasant,Appropriate Orientation: : Oriented to Self,Oriented to Place,Oriented to Situation,Oriented to  Time Alcohol / Substance Use: Not Applicable Psych Involvement: No (comment)  Admission diagnosis:  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 Neurogenic bladder 05/30/2020  . GERD (gastroesophageal reflux  disease) 03/13/2020  . Benzodiazepine dependence ([place]) 03/10/2020  . Cervical radiculopathy 03/10/2020  . 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  Uncomplicated opioid dependence ([place]) 03/10/2020  . Weakness generalized 02/27/2020  . Chronic pain syndrome 01/18/2019  . Fusion of spine, thoracic region 12/06/2017  . History of lumbar spinal fusion 12/06/2017  . Morbid (severe) obesity due to excess calories ([place]) 06/06/2015  . Hypertension 01/27/2015   PCP:  Lucretia Field Pharmacy:   [facility] [place], [place] [address] Phone: [phone] Fax: [phone]     Social Determinants of Health (SDOH) Interventions    Readmission Risk Interventions No flowsheet data found.

## 2020-07-22 NOTE — Progress Notes (Signed)
Inpatient Rehab Admissions Coordinator:   CIR consult received. I will discuss Pt. With medical team tomorrow morning during our AM meeting, as it is not clear if Pt. Is a candidate for CIR. I will update if MD determines that Pt. Demonstrates adequate medical necessity for CIR admission.  Megan Salon, MS, CCC-SLP Rehab Admissions Coordinator  [phone] (celll) [phone] (office)

## 2020-07-22 NOTE — Hospital Course (Addendum)
  Chase Velez is a 30 y.o M who presented with back pain and bilateral lower extremity weakness. PMH significant for chronic back pain s/p T6-L2 fusion, conversion disorder, urinary incontinence, hypertension, and obesity.  Functional Neurological symptom Disorder (Conversion Disorder) Patient presented with low back pain and bilateral lower extremity weakness. He had 1/5 strength on exam and absent sensation below the hips. MRI of lumbar, thoracic, and c-spine were unremarkable. Symptoms thought to be consistent with his history of conversion disorder. He was followed by PT/OT throughout admission.There was also concern for malingering, as patient told the care team he couldn't discharge home until his church built a wheelchair ramp (would take ~1 week). We later discovered he already has a wheelchair ramp installed at his house. His lyrica was tapered ***  Hx of Urinary Retention & Urinary Incontinence Patient with urinary retention on presentation. A foley was placed initially, and then he was allowed to In&Out cath, as that is what he does at home. He has upcoming urodynamic studies in July.   Obesity, Weight Management Maintained on home Metformin. PCP follow-up   Follow up recommendations: D/c Doxepin? consider melatonin instead  Taper Lyrica recommended Valium: frequency decreased to once daily PRN

## 2020-07-22 NOTE — TOC Progression Note (Addendum)
Transition of Care [facility]) - Progression Note    Patient Details  Name: Chase Velez MRN: [id number] Date of Birth: March 06, 1990  Transition of Care [facility]) CM/SW Contact  Eduard Roux, [place] Phone Number: 07/22/2020, 10:51 AM  Clinical Narrative:     Patient has no bed offers at this time- more than likely will not d/c to SNF  [facility] -declined [facility]-declined [facility]-declined [facility]-declined [facility] - no medicaid beds    CSW will continue to follow and assist with discharge planning.  Antony Blackbird, MSW, LCSW Clinical Social Worker      Barriers to Discharge: Inadequate or no insurance,Continued Medical Work up,SNF Pending bed offer  Expected Discharge Plan and Services   In-house Referral: Clinical Social Work     Living arrangements for the past 2 months: Single Family Home                                       Social Determinants of Health (SDOH) Interventions    Readmission Risk Interventions No flowsheet data found.

## 2020-07-22 NOTE — Progress Notes (Addendum)
Family Medicine Teaching Service Daily Progress Note Intern Pager: [phone]  Patient name: Chase Velez Medical record number: [id number] Date of birth: 1990/05/13 Age: 30 y.o. Gender: male  Primary Care Provider: Harold Barban Consultants: Psych (signed off), PT/OT Code Status: Full  Pt Overview and Major Events to Date:  6/5: admitted 6/6: medically stable for discharge to SNF  Assessment and Plan:  Chase Velez is a 30 y.o M who presents with back pain and bilateral lower extremity weakness.  PMH significant for chronic back pain s/p T6-L2 fusion, conversion disorder, urinary incontinence, hypertension, and obesity.  Functional neurological system disorder (conversion disorder) Manifesting as bilateral lower extremity weakness. On physical exam today, 1/5 strength in bilateral lower extremities and absent sensation. Was unable to stand with PT yesterday.  -Continue PT/OT -Appreciate TOC assistance with SNF placement -Tylenol 1g q6h prn for pain -Ibuprofen 400mg  q6h prn for pain -Voltaren gel prn -Continue home Lyrica, Cymbalta, Doxepin, and Valium TID prn  Urinary Retention  Hx of Urinary Incontinence Patient self-caths at baseline (in&out). -Patient may self cath prn -Outpatient urology f/u (urodynamic studies scheduled for July)  Hypertension BP range 115-140 / 68-80 over past 24h. -Holding home losartan-HCTZ -Monitor BP  Obesity, weight management -Continue home metformin -PCP follow-up  FEN/GI: Regular diet PPx: Lovenox   Status is: Inpatient Remains inpatient appropriate because:Unsafe d/c plan   Dispo: The patient is from: Home              Anticipated d/c is to: SNF              Patient currently is medically stable to d/c.   Difficult to place patient No    Subjective:  No acute events overnight. Patient reports his symptoms are unchanged this morning.  Objective: Temp:  [98.1 F (36.7 C)-98.9 F (37.2 C)] 98.2 F (36.8 C) (06/07  0328) Pulse Rate:  [61-82] 61 (06/07 0328) Resp:  [14-20] 18 (06/07 0328) BP: (117-140)/(68-82) 132/80 (06/07 0328) SpO2:  [94 %-97 %] 96 % (06/07 0328) Physical Exam: General: alert, resting comfortably, NAD Cardiovascular: RRR, normal S1/S2 without m/r/g Respiratory: normal effort, lungs CTAB Abdomen: obese, soft, nontender Neuro: oriented x3, CN II-XII intact, 5/5 strength in bilateral upper extremities, 1/5 strength in bilateral lower extremities, absent sensation below the hip per patient report Psych: pleasant, appropriate affect  Laboratory: Recent Labs  Lab 07/20/20 1155 07/20/20 2048  WBC 8.7 10.2  HGB 14.6 13.7  HCT 44.3 41.2  PLT 303 268   Recent Labs  Lab 07/20/20 1155 07/20/20 2048  NA 139  --   K 3.8  --   CL 101  --   CO2 28  --   BUN 9  --   CREATININE 0.87 0.83  CALCIUM 9.4  --   GLUCOSE 100*  --      Imaging/Diagnostic Tests: No new imaging over past 24h  09/19/20, MD 07/22/2020, 6:22 AM PGY-1, [facility] FPTS Intern pager: [phone], text pages welcome

## 2020-07-22 NOTE — Progress Notes (Addendum)
Physical Therapy Treatment Patient Details Name: Chase Velez MRN: [id number] DOB: [birth year] Today's Date: 07/22/2020    History of Present Illness Pt is a 30 y.o. M who presents wtih back pain and BLE weakness. PMH: chronic back pain s/p T6-L2 fusion, conversion disorder, urinary incontinence, HTN, obesity.    PT Comments    Pt reports no appetite or change in symptoms; continues with 0/5 left lower extremity strength and 1/5 right lower extremity strength. Initiated session with BLE PROM/AAROM exercises. As pt is still unable to stand, instructed pt on a slide board use from bed to drop arm chair and pt performed with good carry over at a min guard assist level. Potentially would be able to function at a wheelchair level at home; will plan next session to work on wheelchair mobility and discuss home set up options I.e. ramp.    Follow Up Recommendations  SNF     Equipment Recommendations  Wheelchair (measurements PT);Wheelchair cushion (measurements PT) (Drop arm 3 in 1; 26 in slide board; bariatric equipment)    Recommendations for Other Services       Precautions / Restrictions Precautions Precautions: Fall Restrictions Weight Bearing Restrictions: No    Mobility  Bed Mobility Overal bed mobility: Needs Assistance Bed Mobility: Supine to Sit     Supine to sit: Mod assist     General bed mobility comments: Assist for managing legs off of bed, heavy use of BUE's to push off of bed rails up to sitting position    Transfers Overall transfer level: Needs assistance Equipment used: Sliding board Transfers: Lateral/Scoot Transfers Sit to Stand: Min guard         General transfer comment: Set up for slide board, cues for technique, min guard for taking small scoots over from bed to chair  Ambulation/Gait             General Gait Details: unable   Stairs             Wheelchair Mobility    Modified Rankin (Stroke Patients Only)       Balance  Overall balance assessment: Needs assistance Sitting-balance support: Feet supported;Bilateral upper extremity supported Sitting balance-Leahy Scale: Poor Sitting balance - Comments: Reliant on BUE support, supervision for safety                                    Cognition Arousal/Alertness: Awake/alert Behavior During Therapy: Flat affect Overall Cognitive Status: Within Functional Limits for tasks assessed                                        Exercises General Exercises - Lower Extremity Ankle Circles/Pumps: PROM;AAROM;Both;10 reps;Supine Quad Sets: AROM;Right;10 reps;Supine Heel Slides: PROM;AAROM;Both;Supine;5 reps Hip ABduction/ADduction: PROM;AAROM;Both;5 reps;Supine    General Comments        Pertinent Vitals/Pain Pain Assessment: Faces Faces Pain Scale: Hurts a little bit Pain Location: low back Pain Descriptors / Indicators: Grimacing Pain Intervention(s): Monitored during session    Home Living                      Prior Function            PT Goals (current goals can now be found in the care plan section) Acute Rehab PT Goals Patient Stated Goal: regain strength PT Goal  Formulation: With patient Time For Goal Achievement: 08/04/20 Potential to Achieve Goals: Good Progress towards PT goals: Progressing toward goals    Frequency    Min 5X/week      PT Plan Frequency needs to be updated    Co-evaluation              AM-PAC PT "6 Clicks" Mobility   Outcome Measure  Help needed turning from your back to your side while in a flat bed without using bedrails?: A Lot Help needed moving from lying on your back to sitting on the side of a flat bed without using bedrails?: A Lot Help needed moving to and from a bed to a chair (including a wheelchair)?: A Little Help needed standing up from a chair using your arms (e.g., wheelchair or bedside chair)?: Total Help needed to walk in hospital room?: Total Help  needed climbing 3-5 steps with a railing? : Total 6 Click Score: 10    End of Session Equipment Utilized During Treatment: Gait belt Activity Tolerance: Patient tolerated treatment well Patient left: with call bell/phone within reach;in chair;with chair alarm set Nurse Communication: Mobility status PT Visit Diagnosis: Muscle weakness (generalized) (M62.81);Difficulty in walking, not elsewhere classified (R26.2)     Time: 5462-7035 PT Time Calculation (min) (ACUTE ONLY): 31 min  Charges:  $Therapeutic Exercise: 8-22 mins $Therapeutic Activity: 8-22 mins                     Chase Velez, PT, DPT Acute Rehabilitation Services Pager [phone] Office [phone]    Chase Velez 07/22/2020, 3:56 PM

## 2020-07-22 NOTE — Progress Notes (Signed)
FPTS Interim Progress Note Patient sleeping and resting comfortably.  Rounded with primary RN.  No concerns voiced.  No orders required.  Appreciated nightly round.  Today's Vitals   07/21/20 1938 07/21/20 2124 07/21/20 2319 07/22/20 0328  BP: 140/82  136/78 132/80  Pulse: 80  73 61  Resp: 14  16 18   Temp: 98.1 F (36.7 C)  98.9 F (37.2 C) 98.2 F (36.8 C)  TempSrc: Oral  Oral Oral  SpO2: 96%  94% 96%  PainSc:  8        , MD 07/22/2020, 3:33 AM PGY-2, [facility] Family Medicine Service pager [phone]

## 2020-07-23 LAB — URINALYSIS, ROUTINE W REFLEX MICROSCOPIC
Bilirubin Urine: NEGATIVE
Glucose, UA: NEGATIVE mg/dL
Hgb urine dipstick: NEGATIVE
Ketones, ur: 5 mg/dL — AB
Leukocytes,Ua: NEGATIVE
Nitrite: NEGATIVE
Protein, ur: NEGATIVE mg/dL
Specific Gravity, Urine: 1.023 (ref 1.005–1.030)
pH: 5 (ref 5.0–8.0)

## 2020-07-23 MED ORDER — HYDROCHLOROTHIAZIDE 12.5 MG PO CAPS
12.5000 mg | ORAL_CAPSULE | Freq: Every day | ORAL | Status: DC
  Administered 2020-07-23 – 2020-07-26 (×4): 12.5 mg via ORAL
  Filled 2020-07-23 (×4): qty 1

## 2020-07-23 MED ORDER — LOSARTAN POTASSIUM 50 MG PO TABS
100.0000 mg | ORAL_TABLET | Freq: Every day | ORAL | Status: DC
  Administered 2020-07-23 – 2020-07-26 (×4): 100 mg via ORAL
  Filled 2020-07-23 (×4): qty 2

## 2020-07-23 MED ORDER — ENOXAPARIN SODIUM 100 MG/ML IJ SOSY
90.0000 mg | PREFILLED_SYRINGE | INTRAMUSCULAR | Status: DC
  Administered 2020-07-23 – 2020-07-25 (×3): 90 mg via SUBCUTANEOUS
  Filled 2020-07-23 (×3): qty 1

## 2020-07-23 MED ORDER — SODIUM CHLORIDE 0.9 % IV BOLUS
500.0000 mL | Freq: Once | INTRAVENOUS | Status: AC
  Administered 2020-07-23: 500 mL via INTRAVENOUS

## 2020-07-23 MED ORDER — CALCIUM CARBONATE ANTACID 500 MG PO CHEW
2.0000 | CHEWABLE_TABLET | Freq: Two times a day (BID) | ORAL | Status: DC | PRN
  Administered 2020-07-24: 400 mg via ORAL
  Filled 2020-07-23: qty 2

## 2020-07-23 NOTE — Progress Notes (Signed)
Physical Therapy Treatment Patient Details Name: Chase Velez MRN: [id number] DOB: [birth year] Today's Date: 07/23/2020    History of Present Illness Pt is a 30 y.o. M who presents wtih back pain and BLE weakness. PMH: chronic back pain s/p T6-L2 fusion, conversion disorder, urinary incontinence, HTN, obesity.    PT Comments    Pt denies change in symptoms; does report increased lower back "burning," sensation today. Session focused on transfer training and wheelchair mobility. Pt performing low pivot and slide board transfers at a min assist level. Propelled w/c x 400 feet with bilateral upper extremities with cueing for technique. Provided handouts on slide board transfers, ramp rentals, and building a ramp. Pt checking in with parents regarding doorway widths and seeing if his church can build a ramp. Will continue to progress mobility as tolerated.    Follow Up Recommendations  SNF     Equipment Recommendations  Wheelchair (measurements PT);Wheelchair cushion (measurements PT) (Drop arm 3 in 1; 26 in slide board; bariatric equipment)    Recommendations for Other Services       Precautions / Restrictions Precautions Precautions: Fall Restrictions Weight Bearing Restrictions: No    Mobility  Bed Mobility Overal bed mobility: Needs Assistance Bed Mobility: Supine to Sit     Supine to sit: Min assist     General bed mobility comments: Looped long blanket around pt feet, pt using arms to progress BLE's off edge of bed one at a time. use of bed rail to power trunk up. MinA with use of bed pad to scoot hips to edge of bed    Transfers Overall transfer level: Needs assistance Equipment used: Sliding board;None Transfers: Lateral/Scoot Transfers          Lateral/Scoot Transfers: Min assist;With slide board General transfer comment: Pt performing low pivot from bed to wheelchair, and then slide board transfer from wheelchair to recliner with minA (slight incline). Cues for  hand placement, manual assist for foot placement, pt performing small scoots.  Ambulation/Gait             General Gait Details: unable   Psychologist, counselling mobility: Yes Wheelchair propulsion: Both upper extremities Wheelchair parts: Supervision/cueing Distance: 400 Wheelchair Assistance Details (indicate cue type and reason): cues for hand positioning, technique for turns, supervision for safety. Pt able to negotiate in room around obstacles  Modified Rankin (Stroke Patients Only)       Balance Overall balance assessment: Needs assistance Sitting-balance support: Feet supported;Bilateral upper extremity supported Sitting balance-Leahy Scale: Fair                                      Cognition Arousal/Alertness: Awake/alert Behavior During Therapy: Flat affect Overall Cognitive Status: Within Functional Limits for tasks assessed                                        Exercises      General Comments        Pertinent Vitals/Pain Pain Assessment: Faces Faces Pain Scale: Hurts little more Pain Location: Low back Pain Descriptors / Indicators: Burning Pain Intervention(s): Monitored during session    Home Living  Prior Function            PT Goals (current goals can now be found in the care plan section) Acute Rehab PT Goals Patient Stated Goal: regain strength PT Goal Formulation: With patient Time For Goal Achievement: 08/04/20 Potential to Achieve Goals: Good Progress towards PT goals: Progressing toward goals    Frequency    Min 5X/week      PT Plan Frequency needs to be updated    Co-evaluation              AM-PAC PT "6 Clicks" Mobility   Outcome Measure  Help needed turning from your back to your side while in a flat bed without using bedrails?: A Lot Help needed moving from lying on your back to sitting on the  side of a flat bed without using bedrails?: A Lot Help needed moving to and from a bed to a chair (including a wheelchair)?: A Little Help needed standing up from a chair using your arms (e.g., wheelchair or bedside chair)?: Total Help needed to walk in hospital room?: Total Help needed climbing 3-5 steps with a railing? : Total 6 Click Score: 10    End of Session Equipment Utilized During Treatment: Gait belt Activity Tolerance: Patient tolerated treatment well Patient left: with call bell/phone within reach;in chair Nurse Communication: Mobility status PT Visit Diagnosis: Muscle weakness (generalized) (M62.81);Difficulty in walking, not elsewhere classified (R26.2)     Time: 0737-1062 PT Time Calculation (min) (ACUTE ONLY): 37 min  Charges:  $Therapeutic Activity: 23-37 mins                     Lillia Pauls, PT, DPT Acute Rehabilitation Services Pager [phone] Office [phone]    Norval Morton 07/23/2020, 1:02 PM

## 2020-07-23 NOTE — Progress Notes (Signed)
Occupational Therapy Treatment Patient Details Name: Chase Velez MRN: [id number] DOB: 1990-06-14 Today's Date: 07/23/2020    History of present illness Pt is a 30 y.o. M who presents wtih back pain and BLE weakness. PMH: chronic back pain s/p T6-L2 fusion, conversion disorder, urinary incontinence, HTN, obesity.   OT comments  Pt expressed on going concerns for second event with LB weakness occurring since 2020. Pt educated on reacher and bed level adl needs. Pt will require continued education due to poor return demo requiring more practice. Pt will be unable to access bathrooms with w/c at home. Pt will need bariatric drop arm commode if d/c at w/c level home. Recommendation for SNF.    Follow Up Recommendations  SNF;Supervision/Assistance - 24 hour    Equipment Recommendations   (bariatric drop arm 3n1 if leaving w/c level)    Recommendations for Other Services      Precautions / Restrictions Precautions Precautions: Fall Restrictions Weight Bearing Restrictions: No       Mobility Bed Mobility Overal bed mobility: Needs Assistance Bed Mobility: Rolling Rolling: Min guard   Supine to sit: Min assist     General bed mobility comments: able to clear buttock of bed surface for dressing    Transfers Overall transfer level: Needs assistance Equipment used: Sliding board;None Transfers: Lateral/Scoot Transfers          Lateral/Scoot Transfers: Min assist;With slide board General transfer comment: recently back to bed so bed level care    Balance Overall balance assessment: Needs assistance Sitting-balance support: Feet supported;Bilateral upper extremity supported Sitting balance-Leahy Scale: Fair                                     ADL either performed or assessed with clinical judgement   ADL Overall ADL's : Needs assistance/impaired                     Lower Body Dressing: Maximal assistance;Bed level Lower Body Dressing Details  (indicate cue type and reason): educated on reacher and dressing LLE first. educated dressing bed level with mother at night could help mother go to work and pt wake ready for the day. pt educated on use of reacher with poor return demo. pt will be dependent on family based on this session. pt rolling R / L supervision to clear buttock for dressing by pulling on bed rail. Pt will need a bed ladder to help with positioning or hosptial bed.               General ADL Comments: pt with return to bed from chair with sliding board RN assist on arrival.     Vision       Perception     Praxis      Cognition Arousal/Alertness: Awake/alert Behavior During Therapy: Flat affect Overall Cognitive Status: Within Functional Limits for tasks assessed                                          Exercises Other Exercises Other Exercises: discussed door frame size and how bathing could require bed level due to access in w/c. pt's body habitus makes it difficult for him to see his feet completely supine.   Shoulder Instructions       General Comments VSS    Pertinent  Vitals/ Pain       Pain Assessment: 0-10 Faces Pain Scale: Hurts even more Pain Location: low back Pain Descriptors / Indicators: Burning Pain Intervention(s): Monitored during session;Repositioned  Home Living                                          Prior Functioning/Environment              Frequency  Min 2X/week        Progress Toward Goals  OT Goals(current goals can now be found in the care plan section)  Progress towards OT goals: Progressing toward goals  Acute Rehab OT Goals Patient Stated Goal: to get church to build a ramp OT Goal Formulation: With patient Time For Goal Achievement: 08/04/20 Potential to Achieve Goals: Good ADL Goals Pt Will Perform Lower Body Dressing: with min assist;sitting/lateral leans;sit to/from stand;with adaptive equipment Pt Will  Transfer to Toilet: with min assist;with transfer board;bedside commode Pt Will Perform Toileting - Clothing Manipulation and hygiene: with mod assist;sitting/lateral leans;sit to/from stand;with adaptive equipment Additional ADL Goal #1: Pt will perform bed mobility with minA as precursor for ADL. Additional ADL Goal #2: Pt will perform seated ADL tasks x10 mins with modified independence to increase activity tolerance.  Plan Discharge plan remains appropriate    Co-evaluation                 AM-PAC OT "6 Clicks" Daily Activity     Outcome Measure   Help from another person eating meals?: None Help from another person taking care of personal grooming?: A Little Help from another person toileting, which includes using toliet, bedpan, or urinal?: Total Help from another person bathing (including washing, rinsing, drying)?: A Lot Help from another person to put on and taking off regular upper body clothing?: A Little Help from another person to put on and taking off regular lower body clothing?: A Lot 6 Click Score: 15    End of Session    OT Visit Diagnosis: Unsteadiness on feet (R26.81);Muscle weakness (generalized) (M62.81)   Activity Tolerance Patient tolerated treatment well   Patient Left in bed;with call bell/phone within reach;with bed alarm set   Nurse Communication Mobility status        Time: 2836-6294 OT Time Calculation (min): 21 min  Charges: OT General Charges $OT Visit: 1 Visit OT Treatments $Self Care/Home Management : 8-22 mins   Brynn, OTR/L  Acute Rehabilitation Services Pager: [phone] Office: [phone] .    Mateo Flow 07/23/2020, 3:19 PM

## 2020-07-23 NOTE — Progress Notes (Signed)
Inpatient Rehab Admissions Coordinator:   I reviewed Pt.'s case with rehab MD and Pt. Is not a candidate for CIR. CIR to sign off.  Megan Salon, MS, CCC-SLP Rehab Admissions Coordinator  [phone] (celll) [phone] (office)

## 2020-07-23 NOTE — Progress Notes (Signed)
Family Medicine Teaching Service Daily Progress Note Intern Pager: [phone]  Patient name: Chase Velez Medical record number: [id number] Date of birth: Sep 18, 1990 Age: 30 y.o. Gender: male  Primary Care Provider: Harold Barban Consultants: Psych (signed off), PT/OT Code Status: Full  Pt Overview and Major Events to Date:  6/5: admitted 6/6: medically stable for discharge to SNF/CIR  Assessment and Plan:  Chase Velez is a 30 y.o M who presents with bilateral lower extremity weakness.  PMH significant for chronic back pain s/p T6-L2 fusion, conversion disorder, urinary incontinence, hypertension, and obesity.  Functional neurological disorder (conversion disorder) Patient with persistent bilateral lower extremity weakness, essentially unchanged from admission. Unable to stand/walk. -Continue PT/OT -Looking for placement, SNF vs CIR -Tylenol 1g q6h prn for pain -Ibuprofen 400mg  q6h prn for pain -Voltaren gel prn, lidocaine patch daily -Continue home Lyrica, Cymbalta, Doxepin, and Valium TID prn  Urinary Retention  Hx of Urinary Incontinence Patient self-caths (in&out q6h) at baseline  -Patient may self cath prn -Outpatient urology f/u (urodynamic studies scheduled for July)  Hypertension BP elevated to 140s-150s systolic over past 24h. -Restart home losartan-HCTZ -Monitor BP  Obesity, weight management -Continue home metformin -PCP follow-up  FEN/GI: Regular diet PPx: Lovenox   Status is: Inpatient Remains inpatient appropriate because:Unsafe d/c plan   Dispo: The patient is from: Home              Anticipated d/c is to: SNF vs CIR              Patient currently is medically stable to d/c.   Difficult to place patient No    Subjective:  No acute events overnight. Patient still unable to feel or move his legs. Also reports decreased appetite. Otherwise no complaints.  Objective: Temp:  [98.1 F (36.7 C)-98.8 F (37.1 C)] 98.3 F (36.8 C)  (06/08 0316) Pulse Rate:  [61-105] 61 (06/08 0316) Resp:  [18-20] 20 (06/08 0316) BP: (142-158)/(74-90) 142/74 (06/08 0316) SpO2:  [96 %-100 %] 97 % (06/08 0316) Physical Exam: General: pleasant, resting comfortably in bed Cardiovascular: RRR, normal S1/S2 without m/r/g Respiratory: normal effort Abdomen: obese, soft, nontender Extremities: WWP Neuro: oriented x3, CN II-XII intact, 5/5 strength in bilateral upper extremities, 1/5 strength in bilateral lower extremities, absent sensation below the hip Psych: pleasant, appropriate affect  Laboratory: Recent Labs  Lab 07/20/20 1155 07/20/20 2048  WBC 8.7 10.2  HGB 14.6 13.7  HCT 44.3 41.2  PLT 303 268   Recent Labs  Lab 07/20/20 1155 07/20/20 2048  NA 139  --   K 3.8  --   CL 101  --   CO2 28  --   BUN 9  --   CREATININE 0.87 0.83  CALCIUM 9.4  --   GLUCOSE 100*  --      Imaging/Diagnostic Tests: No new imaging over past 24h.  09/19/20, MD 07/23/2020, 7:18 AM PGY-1, [facility] Family Medicine FPTS Intern pager: [phone], text pages welcome

## 2020-07-23 NOTE — Progress Notes (Signed)
FPTS Interim Progress Note  Patient sleeping and resting comfortably.  Rounded with primary RN.  Patient reported some nausea earlier.  Tried some crackers and no further complaints. No other concerns voiced.  No orders required.  Appreciated nightly round.  Today's Vitals   07/22/20 2325 07/22/20 2359 07/23/20 0030 07/23/20 0316  BP: (!) 158/83   (!) 142/74  Pulse: 79   61  Resp: 18   20  Temp: 98.8 F (37.1 C)   98.3 F (36.8 C)  TempSrc: Oral   Oral  SpO2: 97%   97%  PainSc:  8  5  Asleep    Encourage Tylenol use prn to decrease use of Ibuprofen if possible. Metformin could be contributing to GI symptoms TUMS BID prn  Continue to monitor  Dana Allan, MD 07/23/2020, 4:13 AM PGY-2, [facility] Family Medicine Service pager [phone]

## 2020-07-24 MED ORDER — METFORMIN HCL ER 500 MG PO TB24
500.0000 mg | ORAL_TABLET | Freq: Every day | ORAL | Status: DC
  Administered 2020-07-25: 500 mg via ORAL
  Filled 2020-07-24 (×2): qty 1

## 2020-07-24 NOTE — Progress Notes (Signed)
Physical Therapy Treatment Patient Details Name: Chase Velez MRN: [id number] DOB: Jul 24, 1990 Today's Date: 07/24/2020    History of Present Illness Pt is a 30 y.o. M who presents wtih back pain and BLE weakness. PMH: chronic back pain s/p T6-L2 fusion, conversion disorder, urinary incontinence, HTN, obesity.    PT Comments    The pt remains motivated to progress with functional strengthening and capacity for transfers. The pt was able to demo good progress with use of drop-arm equipment, completing lateral scoot transfers x3 with minG assist and intermittent cues. The pt was then able to complete standing trials with use of sara plus and modA of 2 to facilitate hip clearance and hip extension into standing. Able to tolerate for 1 min initially and 30 seconds on second attempt. Will continue to benefit from skilled PT to progress wt bearing through BLE to maintain strength and increase active engagement.    Follow Up Recommendations  SNF     Equipment Recommendations  Wheelchair (measurements PT);Wheelchair cushion (measurements PT) (Drop arm 3 in 1; 26 in slide board; bariatric equipment)    Recommendations for Other Services       Precautions / Restrictions Precautions Precautions: Fall Restrictions Weight Bearing Restrictions: No    Mobility  Bed Mobility Overal bed mobility: Needs Assistance Bed Mobility: Sit to Supine     Supine to sit: Min guard;HOB elevated     General bed mobility comments: with HOB elevated, pt able to hook LEs to move them off the EOB and scoot hips to EOB with min guard assist and encouragement    Transfers Overall transfer level: Needs assistance Equipment used: Ambulation equipment used;None Transfers: Lateral/Scoot Transfers;Sit to/from Stand Sit to Stand: Mod assist;+2 physical assistance (with sara plus)        Lateral/Scoot Transfers: Min guard;+2 safety/equipment General transfer comment: Pt performed lateral scoot transfer without  the use of sliding board to and from recliner with min guard assist for safety, and increased time.  He was able to stand in the sara plus with mod A +2 to achieve standing  Ambulation/Gait             General Gait Details: unable      Balance Overall balance assessment: Needs assistance Sitting-balance support: Feet supported;Bilateral upper extremity supported Sitting balance-Leahy Scale: Good Sitting balance - Comments: pt able to maintain dynamic sitting balance with min guard assist   Standing balance support: Bilateral upper extremity supported Standing balance-Leahy Scale: Poor Standing balance comment: Using Huntley Dec Plus, pt able to maintain standing for up to 4 mins with                            Cognition Arousal/Alertness: Awake/alert Behavior During Therapy: Flat affect Overall Cognitive Status: Within Functional Limits for tasks assessed                                 General Comments: very flat affect, but agreeable and able to follow all cues      Exercises      General Comments General comments (skin integrity, edema, etc.): VSS.  Pt reports father is investigating having a ramp built      Pertinent Vitals/Pain Pain Assessment: Faces Faces Pain Scale: Hurts even more Pain Location: low back Pain Descriptors / Indicators: Burning;Spasm Pain Intervention(s): Limited activity within patient's tolerance;Monitored during session;Repositioned     PT Goals (  current goals can now be found in the care plan section) Acute Rehab PT Goals Patient Stated Goal: to get church to build a ramp PT Goal Formulation: With patient Time For Goal Achievement: 08/04/20 Potential to Achieve Goals: Good Progress towards PT goals: Progressing toward goals    Frequency    Min 5X/week      PT Plan Current plan remains appropriate    Co-evaluation PT/OT/SLP Co-Evaluation/Treatment: Yes Reason for Co-Treatment: Complexity of the patient's  impairments (multi-system involvement);To address functional/ADL transfers PT goals addressed during session: Mobility/safety with mobility;Proper use of DME;Strengthening/ROM OT goals addressed during session: ADL's and self-care;Strengthening/ROM      AM-PAC PT "6 Clicks" Mobility   Outcome Measure  Help needed turning from your back to your side while in a flat bed without using bedrails?: A Lot Help needed moving from lying on your back to sitting on the side of a flat bed without using bedrails?: A Lot Help needed moving to and from a bed to a chair (including a wheelchair)?: A Little Help needed standing up from a chair using your arms (e.g., wheelchair or bedside chair)?: Total Help needed to walk in hospital room?: Total Help needed climbing 3-5 steps with a railing? : Total 6 Click Score: 10    End of Session Equipment Utilized During Treatment: Gait belt Activity Tolerance: Patient tolerated treatment well Patient left: with call bell/phone within reach;in chair Nurse Communication: Mobility status PT Visit Diagnosis: Muscle weakness (generalized) (M62.81);Difficulty in walking, not elsewhere classified (R26.2)     Time: 1517-1600 PT Time Calculation (min) (ACUTE ONLY): 43 min  Charges:  $Therapeutic Activity: 8-22 mins                     Rolm Baptise, PT, DPT   Acute Rehabilitation Department Pager #: [phone]   Gaetana Michaelis 07/24/2020, 5:12 PM

## 2020-07-24 NOTE — Progress Notes (Signed)
FPTS Interim Progress Note  Spoke with on-call neurosurgeon, Dr. Maisie Fus, about patient's case due to bilateral lower extremity weakness and history of prior T6-L2 fusion in 2017. Also discussed his history of abnormal cervical EMG.  He reviewed patient's chart and felt there are no additional steps to be taken from a neurosurgery standpoint while inpatient, as there are no structural abnormalities on imaging. He recommends outpatient follow up with patient's primary neurosurgeon.  Alternatively, if there are concerns for ascending weakness, he would recommend consulting neurology.    Maury Dus, MD PGY-1, [facility]

## 2020-07-24 NOTE — Progress Notes (Signed)
Occupational Therapy Treatment Patient Details Name: Chase Velez MRN: [id number] DOB: 04-03-1990 Today's Date: 07/24/2020    History of present illness Pt is a 30 y.o. M who presents wtih back pain and BLE weakness. PMH: chronic back pain s/p T6-L2 fusion, conversion disorder, urinary incontinence, HTN, obesity.   OT comments  Pt seen in conjunction with PT.  He was able to perform lateral scoot transfers, without use of sliding board, to and from drop arm recliner with min guard assist.  He was able to stand x 2 with use of sara plus and mod A +2 to move into standing.  He continues with flat affect and reserved with interactions.  Pt reports his father is investigating having a ramp built.   IF family able to manage his needs at home, recommend HHOT instead of SNF.   Follow Up Recommendations  SNF;Supervision/Assistance - 24 hour    Equipment Recommendations  Other (comment) (drop arm bedside commode)    Recommendations for Other Services      Precautions / Restrictions Precautions Precautions: Fall       Mobility Bed Mobility Overal bed mobility: Needs Assistance Bed Mobility: Sit to Supine     Supine to sit: Min guard;HOB elevated     General bed mobility comments: with HOB elevated, pt able to hook LEs to move them off the EOB and scoot hips to EOB with min guard assist and encouragement    Transfers Overall transfer level: Needs assistance Equipment used: None Transfers: Lateral/Scoot Transfers Sit to Stand: Mod assist;+2 physical assistance        Lateral/Scoot Transfers: Min guard;+2 safety/equipment General transfer comment: Pt performed lateral scoot transfer without the use of sliding board to and from recliner with min guard assist for safety, and increased time.  He was able to stand in the sara plus with mod A +2 to achieve standing    Balance Overall balance assessment: Needs assistance Sitting-balance support: Feet supported;Bilateral upper extremity  supported Sitting balance-Leahy Scale: Good Sitting balance - Comments: pt able to maintain dynamic sitting balance with min guard assist   Standing balance support: Bilateral upper extremity supported Standing balance-Leahy Scale: Poor Standing balance comment: Using Huntley Dec Plus, pt able to maintain standing for up to 4 mins with                           ADL either performed or assessed with clinical judgement   ADL Overall ADL's : Needs assistance/impaired                         Toilet Transfer: Minimal assistance;+2 for safety/equipment;+2 for physical assistance;BSC (Drop arm commode (simulation))           Functional mobility during ADLs: Minimal assistance;+2 for safety/equipment       Vision       Perception     Praxis      Cognition Arousal/Alertness: Awake/alert Behavior During Therapy: Flat affect Overall Cognitive Status: Within Functional Limits for tasks assessed                                          Exercises     Shoulder Instructions       General Comments VSS.  Pt reports father is investigating having a ramp built    Pertinent Vitals/ Pain  Pain Assessment: Faces Faces Pain Scale: Hurts even more Pain Location: low back Pain Descriptors / Indicators: Burning;Spasm Pain Intervention(s): Monitored during session;Repositioned;Limited activity within patient's tolerance  Home Living                                          Prior Functioning/Environment              Frequency  Min 2X/week        Progress Toward Goals  OT Goals(current goals can now be found in the care plan section)  Progress towards OT goals: Progressing toward goals     Plan Discharge plan remains appropriate    Co-evaluation    PT/OT/SLP Co-Evaluation/Treatment: Yes Reason for Co-Treatment: Complexity of the patient's impairments (multi-system involvement);To address functional/ADL  transfers   OT goals addressed during session: ADL's and self-care;Strengthening/ROM      AM-PAC OT "6 Clicks" Daily Activity     Outcome Measure   Help from another person eating meals?: None Help from another person taking care of personal grooming?: A Little Help from another person toileting, which includes using toliet, bedpan, or urinal?: A Lot Help from another person bathing (including washing, rinsing, drying)?: A Lot Help from another person to put on and taking off regular upper body clothing?: A Little Help from another person to put on and taking off regular lower body clothing?: A Lot 6 Click Score: 16    End of Session Equipment Utilized During Treatment: Gait belt  OT Visit Diagnosis: Muscle weakness (generalized) (M62.81)   Activity Tolerance Patient tolerated treatment well   Patient Left in chair;with call bell/phone within reach   Nurse Communication Mobility status        Time: 2683-4196 OT Time Calculation (min): 48 min  Charges: OT General Charges $OT Visit: 1 Visit OT Treatments $Neuromuscular Re-education: 23-37 mins  Eber Jones., OTR/L Acute Rehabilitation Services Pager [phone] Office [phone]    Jeani Hawking M 07/24/2020, 4:27 PM

## 2020-07-24 NOTE — Progress Notes (Addendum)
Family Medicine Teaching Service Daily Progress Note Intern Pager: [phone]  Patient name: Chase Velez Medical record number: [id number] Date of birth: [birth year] Age: 30 y.o. Gender: male  Primary Care Provider: Harold Barban Consultants: Psych (signed off) Code Status: Full  Pt Overview and Major Events to Date:  6/5: admitted 6/6: medically stable for discharge to SNF  Assessment and Plan:  Chase Velez is a 30 year old male who presents with bilateral lower extremity weakness. PMH significant for chronic back pain s/p T6-L2 fusion, conversion disorder, urinary retention/incontinence, HTN and obesity.  Functional Neurological Disorder Manifesting as bilateral lower extremity weakness and numbness. Exam essentially unchanged (1/5 strength in BLE, absent sensation). -Continue PT/OT -Continue home Lyrica, Cymbalta, Doxepin, and Valium TID prn -Tylenol, Ibuprofen, and Voltaren gel prn -May be able to discharge home. States his church can help build a ramp for wheelchair accessibility.  Nausea Patient reports decreased appetite and mild nausea. No vomiting. -Switch Metformin to bedtime to improve tolerability. Unfortunately already received today's dose -Tums BID prn -If persistent, can consider Zofran ODT prn   FEN/GI: Regular diet PPx: Lovenox   Status is: Inpatient Remains inpatient appropriate because:Unsafe d/c plan  Dispo: The patient is from: Home              Anticipated d/c is to: SNF vs home              Patient currently is medically stable to d/c.   Difficult to place patient Yes    Subjective:  No acute events overnight. Patient reports decreased appetite and slight nausea but otherwise has no complaints. Has not vomited.  Objective: Temp:  [97.9 F (36.6 C)-98.3 F (36.8 C)] 97.9 F (36.6 C) (06/09 0333) Pulse Rate:  [56-76] 60 (06/09 0333) Resp:  [17-20] 18 (06/09 0333) BP: (126-152)/(59-73) 134/73 (06/09 0333) SpO2:  [96 %-98 %] 96 %  (06/09 0333) Weight:  [175.4 kg] 175.4 kg (06/08 1147) Physical Exam: General: alert, resting comfortably in bed, NAD Cardiovascular: RRR, normal S1/S2 without m/r/g Respiratory: normal effort Abdomen: soft, nontender Extremities: WWP, no lower extremity edema Neuro: pleasant, oriented x4, 5/5 strength in bilateral upper extremities, 1/5 strength in bilateral lower extremities, sensation absent below the hip, CN II-XII intact  Laboratory: Recent Labs  Lab 07/20/20 1155 07/20/20 2048  WBC 8.7 10.2  HGB 14.6 13.7  HCT 44.3 41.2  PLT 303 268   Recent Labs  Lab 07/20/20 1155 07/20/20 2048  NA 139  --   K 3.8  --   CL 101  --   CO2 28  --   BUN 9  --   CREATININE 0.87 0.83  CALCIUM 9.4  --   GLUCOSE 100*  --      Imaging/Diagnostic Tests: No new imaging over past 24h.   Maury Dus, MD 07/24/2020, 6:37 AM PGY-1, [facility] Family Medicine FPTS Intern pager: [phone], text pages welcome

## 2020-07-24 NOTE — Progress Notes (Signed)
FPTS Interim Progress Note  Patient sleeping and resting comfortably.  Rounded with primary RN.  No concerns voiced.  No orders required.  Appreciated nightly round.  Today's Vitals   07/23/20 1949 07/23/20 1950 07/23/20 2311 07/24/20 0333  BP: (!) 152/73  (!) 147/73 134/73  Pulse: 76  62 60  Resp: 17  20 18   Temp: 98.2 F (36.8 C)  98.3 F (36.8 C) 97.9 F (36.6 C)  TempSrc: Oral  Oral Oral  SpO2: 98%  97% 96%  Weight:      PainSc:  1        , MD 07/24/2020, 3:57 AM PGY-2, [facility] Family Medicine Service pager [phone]

## 2020-07-25 MED ORDER — ONDANSETRON 4 MG PO TBDP: 4.0000 mg | ORAL_TABLET | Freq: Three times a day (TID) | ORAL | Status: DC | PRN

## 2020-07-25 MED ORDER — DIAZEPAM 5 MG PO TABS
5.0000 mg | ORAL_TABLET | Freq: Every day | ORAL | Status: DC | PRN
  Administered 2020-07-25: 5 mg via ORAL
  Filled 2020-07-25 (×2): qty 1

## 2020-07-25 MED ORDER — PREGABALIN 100 MG PO CAPS
200.0000 mg | ORAL_CAPSULE | Freq: Two times a day (BID) | ORAL | Status: DC
  Administered 2020-07-25: 200 mg via ORAL
  Filled 2020-07-25: qty 2

## 2020-07-25 MED ORDER — PREGABALIN 100 MG PO CAPS
100.0000 mg | ORAL_CAPSULE | Freq: Every day | ORAL | Status: DC
  Administered 2020-07-26: 100 mg via ORAL
  Filled 2020-07-25: qty 1

## 2020-07-25 MED ORDER — PREGABALIN 100 MG PO CAPS
100.0000 mg | ORAL_CAPSULE | Freq: Once | ORAL | Status: AC
  Administered 2020-07-25: 100 mg via ORAL
  Filled 2020-07-25: qty 1

## 2020-07-25 NOTE — Progress Notes (Signed)
FPTS Interim Progress Note  Patient sleeping and resting comfortably.  Rounded with primary RN.  No concerns voiced.  No orders required.  Appreciated nightly round.  Today's Vitals   07/24/20 2000 07/24/20 2200 07/24/20 2348 07/25/20 0003  BP: 134/73  130/69   Pulse: 80  75   Resp: 18  18   Temp: 98.5 F (36.9 C)  98.5 F (36.9 C)   TempSrc: Oral  Oral   SpO2: 98%  97%   Weight:      PainSc:  4   Asleep     Dana Allan, MD 07/25/2020, 4:15 AM PGY-2, [facility] Health Family Medicine Service pager [phone]

## 2020-07-25 NOTE — Progress Notes (Signed)
Family Medicine Teaching Service Daily Progress Note Intern Pager: [phone]  Patient name: Chase Velez Medical record number: [id number] Date of birth: 1990-07-27 Age: 30 y.o. Gender: male  Primary Care Provider: Harold Barban Consultants: Psych (signed off) Code Status: Full  Pt Overview and Major Events to Date:  6/5: Admitted 6/6: Medically stable for discharge  Assessment and Plan:  Chase Velez is a 30 year old male presenting with bilateral lower extremity weakness.  PMH significant for chronic back pain s/p T6-L2 fusion, conversion disorder, urinary retention and incontinence, HTN and obesity.  Functional neurological disorder Exam minimally improved from admission. Still with 1/5 strength in bilateral lower extremities and absent sensation below the hips. There is some concern for malingering because patient states he can't go home until next Wednesday when his church builds a ramp at his house. However, family members confirm there is already a ramp in place. -Continue PT/OT -TOC assisting with discharge placement, will likely d/c home -Taper Lyrica dose per pharmacy -Continue Cymbalta, Doxepin, and Valium -Outpatient neurosurgery and PCP follow up   FEN/GI: Regular diet PPx: Lovenox   Status is: Inpatient Remains inpatient appropriate because:Unsafe d/c plan  Dispo: The patient is from: Home              Anticipated d/c is to:  home vs SNF              Patient currently is medically stable to d/c.   Difficult to place patient Yes    Subjective:  No acute events overnight. Patient's symptoms unchanged this morning. He still has poor appetite and slight nausea.  Objective: Temp:  [97.6 F (36.4 C)-98.6 F (37 C)] 98.5 F (36.9 C) (06/10 0400) Pulse Rate:  [56-80] 71 (06/10 0400) Resp:  [18-21] 21 (06/10 0400) BP: (124-139)/(61-81) 124/76 (06/10 0400) SpO2:  [97 %-98 %] 98 % (06/10 0400) Physical Exam: General: Alert, pleasant, obese male resting  comfortably in bed Cardiovascular: RRR, normal S1/S2 Respiratory: Normal effort room Abdomen: Soft, nontender Extremities: WWP Neuro: A&O x4, CN II-XII intact, 5/5 strength throughout bilateral upper extremities 1/5 strength in bilateral lower extremities, absent sensation below the hips  Laboratory: Recent Labs  Lab 07/20/20 1155 07/20/20 2048  WBC 8.7 10.2  HGB 14.6 13.7  HCT 44.3 41.2  PLT 303 268   Recent Labs  Lab 07/20/20 1155 07/20/20 2048  NA 139  --   K 3.8  --   CL 101  --   CO2 28  --   BUN 9  --   CREATININE 0.87 0.83  CALCIUM 9.4  --   GLUCOSE 100*  --      Imaging/Diagnostic Tests: No new imaging/diagnostic tests.   Maury Dus, MD 07/25/2020, 7:07 AM PGY-1, [facility] Health Family Medicine FPTS Intern pager: [phone], text pages welcome

## 2020-07-25 NOTE — TOC Progression Note (Addendum)
Transition of Care [facility]) - Progression Note    Patient Details  Name: Chase Velez MRN: [id number] Date of Birth: 03-28-1990  Transition of Care [facility]) CM/SW Contact  Janae Bridgeman, RN Phone Number: 07/25/2020, 9:40 AM  Clinical Narrative:    Case management spoke with the patient's sister on the phone and confirmed that the family is planning to take the patient home to live with the father and mother at the listed address in [place], [place].  The patient's sister states that a ramp was installed at the house more than a year ago and the father has made structural changes to the bathroom at home to accommodate to the patient's needs.  The patient's father is retired and will be able to provide care for the patient at the home.  The patient's sister states that the family has an available wheelchair, cushion, toilet seat, shower seat.  The sister states that the patient has received outpatient PT/OT in the past and that the father would be able to provide transportation as needed for the patient.  CM called and left a voice message with the patient's father, Chase Velez ([phone]) to discuss transitions of care to home and any dme needs.  The patient's sister states that she will transport the patient home at time of discharge and take the patient to the parent's home in [place], [place].  I spoke with the patient's father on the phone and patient has a wheelchair access bathroom, shower seat, commode and ramp present at the home.  The patient's father is willing to pick the patient up by car tomorrow evening and take him back home to their house in [place], [place].  Outpatient PT/OT referral sent to [facility] in [address], [place] close to the father's home.  Clinicals were faxed to [facility]'s rehabilitation to fax # [phone].  I spoke with Chase Velez, Diplomatic Services operational officer at [facility] and she is aware of the outpatient referral and need for outpatient PT/OT.    [employer] was called to have 26 inch slide board delivered to the room.  [employer] only has 30 in slide board at this time and will deliver in place of the 26 in slide board.  I spoke with the patient at the bedside and he is aware that is family planning to pick him up for discharge to home tomorrow evening plans for outpatient physical therapy at [facility] in [place], [place].  CM and MSW will follow for transitions of care needs - family plans to pick the patient up by car for discharge tomorrow evening on 07/26/2020.   Expected Discharge Plan: OP Rehab Barriers to Discharge: No Barriers Identified, Continued Medical Work up  Expected Discharge Plan and Services Expected Discharge Plan: OP Rehab In-house Referral: Clinical Social Work Discharge Planning Services: CM Consult Post Acute Care Choice: Durable Medical Equipment Living arrangements for the past 2 months: Single Family Home                                       Social Determinants of Health (SDOH) Interventions    Readmission Risk Interventions No flowsheet data found.

## 2020-07-25 NOTE — Discharge Instructions (Signed)
Dear Wilfred Lacy,   Thank you for letting us participate in your care! In this section, you will find a brief summary of why you were admitted to the hospital, what happened during your admission, your diagnosis/diagnoses, and recommended follow up.  You were admitted because you were experiencing leg weakness.  You had MRIs of your entire spine, which were normal. You were also seen by physical therapy throughout your admission.    POST-HOSPITAL & CARE INSTRUCTIONS Follow up with your PCP, neurosurgeon, and urologist. Please let PCP/Specialists know of any changes in medications that were made.  Please see medications section of this packet for any medication changes.   DOCTOR'S APPOINTMENTS & FOLLOW UP No future appointments.   Thank you for choosing [facility]! Take care and be well!  Family Medicine Teaching Service Inpatient Team [place]  [facility]  [address] [place], [place] [address] [phone]

## 2020-07-25 NOTE — Plan of Care (Signed)
  Problem: Education: Goal: Knowledge of General Education information will improve Description: Including pain rating scale, medication(s)/side effects and non-pharmacologic comfort measures Outcome: Progressing   Problem: Health Behavior/Discharge Planning: Goal: Ability to manage health-related needs will improve Outcome: Progressing   Problem: Clinical Measurements: Goal: Ability to maintain clinical measurements within normal limits will improve Outcome: Progressing Goal: Will remain free from infection Outcome: Completed/Met Goal: Diagnostic test results will improve Outcome: Completed/Met Goal: Respiratory complications will improve Outcome: Completed/Met Goal: Cardiovascular complication will be avoided Outcome: Completed/Met   Problem: Activity: Goal: Risk for activity intolerance will decrease Outcome: Progressing   Problem: Nutrition: Goal: Adequate nutrition will be maintained Outcome: Progressing   Problem: Coping: Goal: Level of anxiety will decrease Outcome: Progressing   Problem: Elimination: Goal: Will not experience complications related to bowel motility Outcome: Progressing Goal: Will not experience complications related to urinary retention Outcome: Progressing   Problem: Pain Managment: Goal: General experience of comfort will improve Outcome: Progressing   Problem: Safety: Goal: Ability to remain free from injury will improve Outcome: Progressing   Problem: Skin Integrity: Goal: Risk for impaired skin integrity will decrease Outcome: Progressing

## 2020-07-25 NOTE — Progress Notes (Signed)
Occupational Therapy Treatment Patient Details Name: Chase Velez MRN: [id number] DOB: 22-Feb-1990 Today's Date: 07/25/2020    History of present illness Pt is a 30 y.o. M who presents wtih back pain and BLE weakness. PMH: chronic back pain s/p T6-L2 fusion, conversion disorder, urinary incontinence, HTN, obesity.   OT comments  Pt seen in conjunction with PT.  He is making excellent progress - he was able to ambulate ~10' with use of stedy lift.  He was more animated today.  He is unable to access his feet for LB ADLs due to increased pain when leaning forward. He will likely benefit from AE for LB ADLs.   Follow Up Recommendations  Home health OT;Supervision/Assistance - 24 hour    Equipment Recommendations  Tub/shower bench    Recommendations for Other Services      Precautions / Restrictions Precautions Precautions: Fall Restrictions Weight Bearing Restrictions: No       Mobility Bed Mobility               General bed mobility comments: Pt EOB with PT    Transfers Overall transfer level: Needs assistance Equipment used: Ambulation equipment used;None   Sit to Stand: Min guard;+2 safety/equipment         General transfer comment: Pt able to move sit to stand with bed height elevated and use of Sara Plus.  He then ambulated `10 feet    Balance Overall balance assessment: Needs assistance Sitting-balance support: Feet supported;Bilateral upper extremity supported Sitting balance-Leahy Scale: Good Sitting balance - Comments: pt able to maintain dynamic sitting balance with min guard assist   Standing balance support: Bilateral upper extremity supported Standing balance-Leahy Scale: Poor Standing balance comment: Using Huntley Dec Plus, pt able to maintain standing for up to 4 mins with                           ADL either performed or assessed with clinical judgement   ADL Overall ADL's : Needs assistance/impaired                     Lower  Body Dressing: Maximal assistance;Sit to/from stand Lower Body Dressing Details (indicate cue type and reason): Pt able to push his sock down his Lt heal while seated in chair, but reports increased back pain which limits completion of the task             Functional mobility during ADLs: Minimal assistance;+2 for safety/equipment (sara plus)       Vision       Perception     Praxis      Cognition Arousal/Alertness: Awake/alert Behavior During Therapy: Flat affect Overall Cognitive Status: Within Functional Limits for tasks assessed                                 General Comments: Pt with flat affect, but more interactive today        Exercises     Shoulder Instructions       General Comments      Pertinent Vitals/ Pain       Pain Assessment: Faces Faces Pain Scale: Hurts little more Pain Location: low back Pain Descriptors / Indicators: Burning;Spasm Pain Intervention(s): Monitored during session;Limited activity within patient's tolerance  Home Living  Prior Functioning/Environment              Frequency  Min 2X/week        Progress Toward Goals  OT Goals(current goals can now be found in the care plan section)  Progress towards OT goals: Progressing toward goals  Acute Rehab OT Goals Patient Stated Goal: to get strength back  Plan Discharge plan remains appropriate;Frequency needs to be updated;Equipment recommendations need to be updated    Co-evaluation    PT/OT/SLP Co-Evaluation/Treatment: Yes Reason for Co-Treatment: Complexity of the patient's impairments (multi-system involvement);For patient/therapist safety PT goals addressed during session: Mobility/safety with mobility (Simultaneous filing. User may not have seen previous data.) OT goals addressed during session: ADL's and self-care      AM-PAC OT "6 Clicks" Daily Activity     Outcome Measure   Help  from another person eating meals?: None Help from another person taking care of personal grooming?: A Little Help from another person toileting, which includes using toliet, bedpan, or urinal?: A Lot Help from another person bathing (including washing, rinsing, drying)?: A Lot Help from another person to put on and taking off regular upper body clothing?: A Little Help from another person to put on and taking off regular lower body clothing?: A Lot 6 Click Score: 16    End of Session    OT Visit Diagnosis: Muscle weakness (generalized) (M62.81)   Activity Tolerance Patient tolerated treatment well   Patient Left in chair;with call bell/phone within reach   Nurse Communication Mobility status        Time: 7262-0355 OT Time Calculation (min): 10 min  Charges: OT General Charges $OT Visit: 1 Visit OT Treatments $Therapeutic Activity: 8-22 mins  Eber Jones., OTR/L Acute Rehabilitation Services Pager [phone] Office [phone]    Jeani Hawking M 07/25/2020, 4:19 PM

## 2020-07-25 NOTE — Progress Notes (Signed)
Physical Therapy Treatment Patient Details Name: Chase Velez MRN: [id number] DOB: 01-27-1991 Today's Date: 07/25/2020    History of Present Illness Pt is a 30 y.o. M who presents wtih back pain and BLE weakness. PMH: chronic back pain s/p T6-L2 fusion, conversion disorder, urinary incontinence, HTN, obesity.    PT Comments    Pt making great progress with functional mobility and activation of BLE this session. He was able to complete bed mobility (transition to long-sitting and then move BLE OOB) from flat bed with minG only for safety but no physical assistance. The pt was then able to complete multiple sit-stand transfers from EOB with use of sara plus, progressing from modA of 2 to complete transition to minG of 1-2 with use of equipment. The pt demos increased activation of glutes and quads with standing, was able to complete x4 partial sit-stand transfers to increase load and functional strength in BLE. The pt was able to progress to taking forward steps with BUE supported on sara plus, continues to demo deficits in LE strength resulting in decreased hip and knee flexion for foot advancement and need for assist to maintain balance with gait. Continue to recommend skilled therapy after d/c, will be safe to return home with DME and family support.    Follow Up Recommendations  Home health PT;Supervision for mobility/OOB     Equipment Recommendations  Wheelchair cushion (measurements PT);Wheelchair (measurements PT) (Drop arm 3 in 1; 26 in slide board; bariatric equipment)    Recommendations for Other Services       Precautions / Restrictions Precautions Precautions: Fall Restrictions Weight Bearing Restrictions: No    Mobility  Bed Mobility Overal bed mobility: Needs Assistance Bed Mobility: Sit to Supine     Supine to sit: Min guard     General bed mobility comments: Pt EOB with PT    Transfers Overall transfer level: Needs assistance Equipment used: Ambulation  equipment used;None Transfers: Sit to/from Stand Sit to Stand: Min guard;+2 safety/equipment         General transfer comment: Pt able to move sit to stand with bed height elevated and use of Sara Plus.  He then ambulated `10 feet  Ambulation/Gait Ambulation/Gait assistance: Min assist;+2 physical assistance;+2 safety/equipment Gait Distance (Feet): 8 Feet Assistive device: Bilateral platform walker (sara plus (foot plate removed)) Gait Pattern/deviations: Step-through pattern;Decreased stride length;Decreased dorsiflexion - left;Decreased dorsiflexion - right;Leaning posteriorly Gait velocity: decreased   General Gait Details: pt able to take small forward steps while supported by sara plus. initially cues for stepping but pt eventually able to maintain without cues. minimal hip and knee flexion, but able to wt shift to advance BLE       Balance Overall balance assessment: Needs assistance Sitting-balance support: Feet supported;Bilateral upper extremity supported Sitting balance-Leahy Scale: Good Sitting balance - Comments: pt able to maintain dynamic sitting balance with min guard assist   Standing balance support: Bilateral upper extremity supported Standing balance-Leahy Scale: Poor Standing balance comment: Using Huntley Dec Plus, pt able to maintain standing for up to 4 mins with                            Cognition Arousal/Alertness: Awake/alert Behavior During Therapy: Flat affect Overall Cognitive Status: Within Functional Limits for tasks assessed                                 General  Comments: Pt with flat affect, but more interactive today      Exercises Other Exercises Other Exercises: standing wt shift with addition of slight knee flexion and then extension in standing for quad activation. x10 each leg Other Exercises: standing wt shift with leg advancement and then sliding back underneath the pt. x10 each leg Other Exercises: partial  sit-stand with use of sara plus, cues for increased hip extension activation    General Comments General comments (skin integrity, edema, etc.): VSS      Pertinent Vitals/Pain Pain Assessment: Faces Faces Pain Scale: Hurts little more Pain Location: low back Pain Descriptors / Indicators: Burning;Spasm Pain Intervention(s): Monitored during session;Limited activity within patient's tolerance     PT Goals (current goals can now be found in the care plan section) Acute Rehab PT Goals Patient Stated Goal: to get strength back PT Goal Formulation: With patient Time For Goal Achievement: 08/04/20 Potential to Achieve Goals: Good Progress towards PT goals: Progressing toward goals    Frequency    Min 5X/week      PT Plan Current plan remains appropriate    Co-evaluation PT/OT/SLP Co-Evaluation/Treatment: Yes Reason for Co-Treatment: Complexity of the patient's impairments (multi-system involvement);For patient/therapist safety PT goals addressed during session: Mobility/safety with mobility OT goals addressed during session: ADL's and self-care      AM-PAC PT "6 Clicks" Mobility   Outcome Measure  Help needed turning from your back to your side while in a flat bed without using bedrails?: A Little Help needed moving from lying on your back to sitting on the side of a flat bed without using bedrails?: A Little Help needed moving to and from a bed to a chair (including a wheelchair)?: A Little Help needed standing up from a chair using your arms (e.g., wheelchair or bedside chair)?: A Lot Help needed to walk in hospital room?: A Lot Help needed climbing 3-5 steps with a railing? : Total 6 Click Score: 14    End of Session Equipment Utilized During Treatment: Gait belt Activity Tolerance: Patient tolerated treatment well Patient left: with call bell/phone within reach;in chair Nurse Communication: Mobility status PT Visit Diagnosis: Muscle weakness (generalized)  (M62.81);Difficulty in walking, not elsewhere classified (R26.2)     Time: 9242-6834 PT Time Calculation (min) (ACUTE ONLY): 47 min  Charges:  $Gait Training: 8-22 mins $Therapeutic Exercise: 8-22 mins                     Rolm Baptise, PT, DPT   Acute Rehabilitation Department Pager #: [phone]   Gaetana Michaelis 07/25/2020, 4:26 PM

## 2020-07-26 DIAGNOSIS — G479 Sleep disorder, unspecified: Secondary | ICD-10-CM

## 2020-07-26 DIAGNOSIS — F419 Anxiety disorder, unspecified: Secondary | ICD-10-CM

## 2020-07-26 DIAGNOSIS — R29898 Other symptoms and signs involving the musculoskeletal system: Secondary | ICD-10-CM

## 2020-07-26 MED ORDER — PREGABALIN 100 MG PO CAPS: ORAL_CAPSULE | ORAL | 0 refills | Status: AC

## 2020-07-26 MED ORDER — DIAZEPAM 5 MG PO TABS
5.0000 mg | ORAL_TABLET | Freq: Every day | ORAL | 0 refills | Status: AC | PRN
Start: 2020-07-26 — End: ?

## 2020-07-26 MED ORDER — ONDANSETRON 4 MG PO TBDP: 4.0000 mg | ORAL_TABLET | Freq: Three times a day (TID) | ORAL | 0 refills | Status: AC | PRN

## 2020-07-26 NOTE — Progress Notes (Signed)
FPTS Interim Progress Note  Patient sleeping and resting comfortably.  Rounded with primary RN, reports patient having back spasms earlier.  No other concerns voiced.  No orders required.  Appreciated nightly round.  Today's Vitals   07/25/20 1944 07/25/20 2254 07/26/20 0237 07/26/20 0411  BP:  (!) 150/66  130/67  Pulse:  69  64  Resp:      Temp:  98.5 F (36.9 C)  98.1 F (36.7 C)  TempSrc:      SpO2:  95%  96%  Weight:      Height:      PainSc: 8   5      Noted that Lyrica decreased form 700 mg to 500 mg daily.  I wonder if this taper may be causing the increase in spasm.  He continues to remain on Valium daily.  Continue to monitor  Dana Allan, MD 07/26/2020, 4:47 AM PGY-2, [facility] Health Family Medicine Service pager [phone]

## 2020-07-26 NOTE — Progress Notes (Signed)
Spoke with pt's Dad regarding discharge today. Sister will pick up the patient and parents to pick up from sister's home today. Patient medically stable for discharge.   Katha Cabal, DO PGY-2, [facility] 07/26/2020 1:39 PM

## 2020-07-26 NOTE — Discharge Summary (Signed)
Family Medicine Teaching [facility] Discharge Summary  Patient name: Chase Velez Medical record number: [id number] Date of birth: 1990-04-11 Age: 30 y.o. Gender: male Date of Admission: 07/20/2020  Date of Discharge: 07/26/20 Admitting Physician: Maury DusAshleigh Wells, MD  Primary Care Provider: Harold BarbanVieau, Christopher Consultants: Psychiatry  Indication for Hospitalization: Bilateral lower extremity weakness   Discharge Diagnoses/Problem List:  Functional Neurological Discharge  Depressive disorder Generalized anxiety disorder Urinary difficulties: Mixed incontinence and retention Hypertension History Acne   Disposition: Home   Discharge Condition: Improving  Discharge Exam:  GEN: pleasant obese male, in no acute distress resting in bed  CV: regular rate and rhythm, no murmurs appreciated  RESP: no increased work of breathing, clear to ascultation bilaterally with no crackles, wheezes, or rhonchi  ABD: Bowel sounds present. Soft, Nontender, Nondistended.  MSK: no LE edema SKIN: warm, dry NEURO: alert and oriented x4, CN II-XII intact, 5/5 strength throughout bilateral UE 1/5 strength in bilateral lower extremities, dull sensation present in LE (improvement) PSYCH: flat affect, appropriate speech and behavior    Brief Hospital Course:    Chase LacyRichard Hatchell is a 30 y.o M who presented with back pain and bilateral lower extremity weakness. PMH significant for chronic back pain s/p T6-L2 fusion, conversion disorder, urinary incontinence, hypertension, and obesity.  Functional Neurological Disorder (Conversion Disorder) Patient presented with low back pain and bilateral lower extremity weakness. He had 1/5 strength on exam and absent sensation below the hips. MRI of lumbar, thoracic, and cervical spine were unremarkable. Symptoms thought to be consistent with his history of functional neurological disorder. He was followed by PT/OT throughout admission.There was also concern for  malingering, as patient told the care team he couldn't discharge home until his church built a wheelchair ramp (would take ~1 week). We later discovered he already has a wheelchair ramp installed at his house. His lyrica taper was attempted but patient did not tolerate well overnight and as discharged on his home dose.   Hx of Urinary Retention & Urinary Incontinence Patient with urinary retention on presentation. A foley was placed initially, and then he was allowed to In&Out cath, as that is what he does at home. He has upcoming urodynamic studies in July.   Obesity, Weight Management Maintained on home Metformin. PCP follow-up   Follow up recommendations: D/c Doxepin? consider melatonin instead  Taper Lyrica recommended Valium: frequency decreased to once daily PRN    Issues for Follow Up:  Recommend continuing to taper benzodiazepine and Lyrica. Discontinuing doxepin and trialing melatonin.   Significant Procedures: MRI Spine (Cervical, Thoracic, Lumbar)  Significant Labs and Imaging:  Recent Labs  Lab 07/20/20 1155 07/20/20 2048  WBC 8.7 10.2  HGB 14.6 13.7  HCT 44.3 41.2  PLT 303 268   Recent Labs  Lab 07/20/20 1155 07/20/20 2048  NA 139  --   K 3.8  --   CL 101  --   CO2 28  --   GLUCOSE 100*  --   BUN 9  --   CREATININE 0.87 0.83  CALCIUM 9.4  --    DG THORACOLUMABAR SPINE  Result Date: 07/20/2020 CLINICAL DATA:  Larey SeatFell yesterday, legs came out, severe back pain, groin numbness, unable to urinate, history of back surgery EXAM: THORACOLUMBAR SPINE 1V COMPARISON:  None FINDINGS: Prior thoracolumbar fusion, T6-L2. Hardware appears intact. 5 non-rib-bearing lumbar vertebra. Compression fractures of T10 and superior endplates of T9 and T11, appear old. Remaining vertebral body heights maintained. No additional fracture,, subluxation, or bone destruction. SI joints  preserved. IMPRESSION: Prior T6-L2 fusion. Old appearing compression fractures of T10 and superior  endplate T9. No acute osseous abnormalities. Electronically Signed   By: Ulyses Southward M.D.   On: 07/20/2020 13:17   MR CERVICAL SPINE WO CONTRAST  Result Date: 07/20/2020 CLINICAL DATA:  Lower extremity weakness EXAM: MRI CERVICAL SPINE WITHOUT CONTRAST TECHNIQUE: Multiplanar, multisequence MR imaging of the cervical spine was performed. No intravenous contrast was administered. COMPARISON:  None. FINDINGS: Alignment: Physiologic. Vertebrae: No fracture, evidence of discitis, or bone lesion. Cord: Normal signal and morphology. Posterior Fossa, vertebral arteries, paraspinal tissues: Negative. Disc levels: C1-C2: Normal. C2-C3: Normal disc space and facets. No spinal canal or neuroforaminal stenosis. C3-C4: Small right asymmetric disc bulge with right uncovertebral spurring. Mild right foraminal stenosis. C4-C5: Right asymmetric uncovertebral spurring. Mild right foraminal stenosis. C5-C6: Normal disc space and facets. No spinal canal or neuroforaminal stenosis. C6-C7: Normal disc space and facets. No spinal canal or neuroforaminal stenosis. C7-T1: Normal disc space and facets. No spinal canal or neuroforaminal stenosis. IMPRESSION: 1. No cervical spinal canal stenosis. 2. Mild right C3-C4 and C4-C5 neural foraminal stenosis due to uncovertebral spurring. Electronically Signed   By: Deatra Robinson M.D.   On: 07/20/2020 22:53   MR THORACIC SPINE WO CONTRAST  Result Date: 07/20/2020 CLINICAL DATA:  Back pain.  Fell.  Unable to urinate. EXAM: MRI THORACIC SPINE WITHOUT CONTRAST TECHNIQUE: Multiplanar, multisequence MR imaging of the thoracic spine was performed. No intravenous contrast was administered. COMPARISON:  Radiography earlier same day FINDINGS: Alignment:  No malalignment. Vertebrae: Thoracolumbar fusion procedure from T6-L2. Old compression fracture at T10 with loss of height of 50%. Mild loss of height also at T11 and T12. Cord:  No cord compression or primary cord lesion. Paraspinal and other soft  tissues: No paravertebral abnormality identified. Disc levels: No disc space abnormality at T5-6 or above. Mild facet and ligamentous hypertrophy at T4-5 and T5-6 without encroachment upon the neural structures. This could contribute to upper thoracic back pain. The left-sided pedicle screw at T6 comes very close to the disc space, but there does not appear to be T5-6 disc degeneration. T6 through T12: Previous fusion procedure as above. Limited detail because of artifact from fusion hardware. No apparent acute or unexpected finding in that region. Apparent sufficient patency of the canal and foramina, allowing for the artifact. IMPRESSION: No acute traumatic finding. Previous thoracolumbar fusion from T6-L2 has a satisfactory appearance. Mild facet and ligamentous degenerative changes and hypertrophy at T4-5 and T5-6, but without compressive encroachment upon the canal or foramina. Electronically Signed   By: Paulina Fusi M.D.   On: 07/20/2020 16:19   MR LUMBAR SPINE WO CONTRAST  Result Date: 07/20/2020 CLINICAL DATA:  Larey Seat yesterday with leg weakness and back pain. Unable to urinate. EXAM: MRI LUMBAR SPINE WITHOUT CONTRAST TECHNIQUE: Multiplanar, multisequence MR imaging of the lumbar spine was performed. No intravenous contrast was administered. COMPARISON:  Radiography earlier same day FINDINGS: Segmentation:  5 lumbar type vertebral bodies. Alignment:  Normal Vertebrae: Thoracolumbar fusion extending as low as L2. No abnormality seen below that. Conus medullaris and cauda equina: Conus extends to the L1 level. Conus and cauda equina appear normal. Paraspinal and other soft tissues: Negative Disc levels: L1-2: Normal.  Solid fusion. L2-3: No disc abnormality. Mild facet degeneration and hypertrophy. No compressive stenosis. L3-4: No disc abnormality. Minimal facet and ligamentous prominence. No stenosis. L4-5: Minimal bulging of the disc. Minimal facet and ligamentous prominence. No compressive stenosis.  L5-S1: Normal interspace. IMPRESSION:  Thoracolumbar fusion extends down as far as L2. The L1-2 level is unremarkable. No compressive stenosis from L2-3 through L5-S1. At L3-4, there is some adjacent segment facet degeneration and hypertrophy but without discernible edematous change or compressive encroachment upon the neural spaces. Electronically Signed   By: Paulina Fusi M.D.   On: 07/20/2020 16:14      Results/Tests Pending at Time of Discharge: 2  Discharge Medications:  Allergies as of 07/26/2020       Reactions   Lisinopril Anaphylaxis, Other (See Comments)   01/06/2019 23:31 EST - Dysphagia, concerned this is post pharyngeal swelling 2/2 ACEi. Switched to losartan 01/06/2019   Septra [sulfamethoxazole-trimethoprim] Anaphylaxis, Swelling, Rash   Eyes swelled shut   Hydrocodone Rash   Can tolerate oxycodone   Latex Hives, Itching, Rash   Lorazepam Other (See Comments)   Hallucinations   Tape Rash   Paper tape        Medication List     STOP taking these medications    baclofen 10 MG tablet Commonly known as: LIORESAL       TAKE these medications    acetaminophen 500 MG tablet Commonly known as: TYLENOL Take 1,000 mg by mouth in the morning and at bedtime.   cholecalciferol 25 MCG (1000 UNIT) tablet Commonly known as: VITAMIN D3 Take 1,000 Units by mouth daily.   diazepam 5 MG tablet Commonly known as: VALIUM Take 1 tablet (5 mg total) by mouth daily as needed for anxiety. What changed: when to take this   doxycycline 50 MG capsule Commonly known as: VIBRAMYCIN Take 100 mg by mouth daily.   DULoxetine 30 MG capsule Commonly known as: CYMBALTA Take 30 mg by mouth 2 (two) times daily.   EPINEPHrine 0.3 mg/0.3 mL Soaj injection Commonly known as: EPI-PEN Inject 0.3 mg into the muscle as needed for anaphylaxis.   esomeprazole 40 MG capsule Commonly known as: NEXIUM Take 40 mg by mouth 2 (two) times daily with breakfast and lunch.    losartan-hydrochlorothiazide 100-12.5 MG tablet Commonly known as: HYZAAR Take 1 tablet by mouth daily.   metFORMIN 500 MG tablet Commonly known as: GLUCOPHAGE Take 1 tablet by mouth daily with breakfast.   ondansetron 4 MG disintegrating tablet Commonly known as: ZOFRAN-ODT Take 1 tablet (4 mg total) by mouth every 8 (eight) hours as needed for nausea or vomiting.   pregabalin 200 MG capsule Commonly known as: LYRICA Take 200 mg by mouth 3 (three) times daily. What changed: Another medication with the same name was added. Make sure you understand how and when to take each.   pregabalin 100 MG capsule Commonly known as: Lyrica Taper dose down per dates below to decrease side effects: 07/26/20- 6/16 : take 100mg  in the morning, 200mg  in the afternoon and at bedtime 08/01/20 - 6/23: take 100mg  in the morning and afternoon, then 200mg  at bedtime 08/08/20 - 6/30: take 100mg  three times daily Follow up with Dr. What changed: You were already taking a medication with the same name, and this prescription was added. Make sure you understand how and when to take each.   vitamin B-12 1000 MCG tablet Commonly known as: CYANOCOBALAMIN Take 1,000 mcg by mouth daily.   vitamin C 500 MG tablet Commonly known as: ASCORBIC ACID Take 500 mg by mouth daily.               Durable Medical Equipment  (From admission, onward)           Start  Ordered   07/25/20 1037  For home use only DME Other see comment  Once       Comments: Needs bariatric 26 in slide board  Question:  Length of Need  Answer:  6 Months   07/25/20 1037            Discharge Instructions: Please refer to Patient Instructions section of EMR for full details.  Patient was counseled important signs and symptoms that should prompt return to medical care, changes in medications, dietary instructions, activity restrictions, and follow up appointments.   Follow-Up Appointments:  Follow-up Information      Harold Barban. Schedule an appointment as soon as possible for a visit.   Why: Please make an appointment with your primary care provider to been seen in the next 7-10 days for a hospital follow up. Contact information: [address] [place] [address] [phone]         Center, [facility]. Call.   Why: Please follow up with [facility] for  Outpatient physical therapy and occupational therapy department in [place], [place].  Please contact [phone] to be sure the patient is set up for therapy.  Clinicals were faxed to [phone]. Contact information: [address] [place] [address] [phone]         [facility] Follow up.   Why: [employer] will be providing you with a bariatric slide board to assist with wheelchair mobility and use. Contact information: [address] [place] [address] [phone]                 Katha Cabal, DO 07/26/2020, 1:38 PM PGY-2, [facility]

## 2020-07-26 NOTE — Progress Notes (Signed)
Physical Therapy Treatment Patient Details Name: Chase Velez MRN: [id number] DOB: 11-Mar-1990 Today's Date: 07/26/2020    History of Present Illness Pt is a 30 y.o. M who presents wtih back pain and BLE weakness. PMH: chronic back pain s/p T6-L2 fusion, conversion disorder, urinary incontinence, HTN, obesity.    PT Comments    Pt has made significant progress with mobility, only needing min guard assist to ambulate up to ~60 ft with a RW. Pt also able to perform transfers from various levels with min guard. Pt educated on safely progressing his mobility at home with someone to guard him for safety. Will continue to follow acutely. Current recommendations remain appropriate.    Follow Up Recommendations  Home health PT;Supervision for mobility/OOB     Equipment Recommendations  Wheelchair cushion (measurements PT);Wheelchair (measurements PT) (Drop arm 3 in 1; 26 in slide board; bariatric equipment)    Recommendations for Other Services       Precautions / Restrictions Precautions Precautions: Fall Restrictions Weight Bearing Restrictions: No    Mobility  Bed Mobility Overal bed mobility: Modified Independent Bed Mobility: Supine to Sit;Sit to Supine     Supine to sit: HOB elevated Sit to supine: HOB elevated   General bed mobility comments: Pt uses his UEs to assist his legs onto/off the bed, with improved lower extremity movement against gravity with return to supine. No physical assistance needed from therapist.    Transfers Overall transfer level: Needs assistance Equipment used: Rolling walker (2 wheeled) Transfers: Sit to/from Stand Sit to Stand: Min guard         General transfer comment: min guard for balance and safety, powering up to stand 4x from EOB, 2x with bed elevated and 2x with bed at lower level.  Ambulation/Gait Ambulation/Gait assistance: Min guard Gait Distance (Feet): 60 Feet (x3 bouts of ~15 ft > ~40 ft > ~60 ft) Assistive device: Rolling  walker (2 wheeled) Gait Pattern/deviations: Step-through pattern;Decreased stride length;Decreased dorsiflexion - left;Decreased dorsiflexion - right Gait velocity: decreased Gait velocity interpretation: <1.31 ft/sec, indicative of household ambulator General Gait Details: Started with lateral weight shifting then marching in place to begin gait training then advanced to ambulating forwards and backwards and then around room. No LOB noted, min guard for safety. Cues provided to increase bil feet clearance, momentary success. Bil knee instability noted throughout, increasing with fatigue, x1 knee buckle but pt able to recover without physical assistance Pt with inferior gaze looking at feet for placement throughout, cuing pt to look superiorly and anteriorly instead, but decreased feet clearance when he follows this cue.   Stairs             Wheelchair Mobility    Modified Rankin (Stroke Patients Only)       Balance Overall balance assessment: Needs assistance Sitting-balance support: Feet supported Sitting balance-Leahy Scale: Good     Standing balance support: During functional activity;Bilateral upper extremity supported Standing balance-Leahy Scale: Poor Standing balance comment: Reliant on UE support on RW.                            Cognition Arousal/Alertness: Awake/alert Behavior During Therapy: Flat affect Overall Cognitive Status: Within Functional Limits for tasks assessed                                        Exercises  General Comments        Pertinent Vitals/Pain Pain Assessment: Faces Faces Pain Scale: Hurts a little bit Pain Location: low back Pain Descriptors / Indicators: Guarding Pain Intervention(s): Limited activity within patient's tolerance;Monitored during session;Repositioned    Home Living                      Prior Function            PT Goals (current goals can now be found in the care  plan section) Acute Rehab PT Goals Patient Stated Goal: to improve and go home PT Goal Formulation: With patient Time For Goal Achievement: 08/04/20 Potential to Achieve Goals: Good Progress towards PT goals: Progressing toward goals    Frequency    Min 5X/week      PT Plan Current plan remains appropriate    Co-evaluation              AM-PAC PT "6 Clicks" Mobility   Outcome Measure  Help needed turning from your back to your side while in a flat bed without using bedrails?: None Help needed moving from lying on your back to sitting on the side of a flat bed without using bedrails?: None Help needed moving to and from a bed to a chair (including a wheelchair)?: A Little Help needed standing up from a chair using your arms (e.g., wheelchair or bedside chair)?: A Little Help needed to walk in hospital room?: A Little Help needed climbing 3-5 steps with a railing? : A Lot 6 Click Score: 19    End of Session Equipment Utilized During Treatment: Gait belt Activity Tolerance: Patient tolerated treatment well Patient left: in bed;with call bell/phone within reach;with bed alarm set Nurse Communication: Mobility status PT Visit Diagnosis: Muscle weakness (generalized) (M62.81);Difficulty in walking, not elsewhere classified (R26.2);Unsteadiness on feet (R26.81);Other abnormalities of gait and mobility (R26.89)     Time: 6578-4696 PT Time Calculation (min) (ACUTE ONLY): 19 min  Charges:  $Gait Training: 8-22 mins                     Raymond Gurney, PT, DPT Acute Rehabilitation Services  Pager: [phone] Office: [phone]    Chase Velez 07/26/2020, 2:31 PM

## 2020-07-26 NOTE — Progress Notes (Signed)
Occupational Therapy Treatment Patient Details Name: Chase Velez MRN: [id number] DOB: 08-Dec-1990 Today's Date: 07/26/2020    History of present illness Pt is a 30 y.o. M who presents wtih back pain and BLE weakness. PMH: chronic back pain s/p T6-L2 fusion, conversion disorder, urinary incontinence, HTN, obesity.   OT comments  Pt was instructed in use of AE for LB ADLs and was able to perform LB dressing with min guard assist moving sit to stand with RW today!.  AE issued via indigent supply.   Recommend HHOT at discharge.   Follow Up Recommendations  Home health OT;Supervision/Assistance - 24 hour    Equipment Recommendations  Tub/shower bench    Recommendations for Other Services      Precautions / Restrictions Precautions Precautions: Fall       Mobility Bed Mobility Overal bed mobility: Modified Independent             General bed mobility comments: Pt did not require assist to move supine <> EOB    Transfers Overall transfer level: Needs assistance Equipment used: Rolling walker (2 wheeled) Transfers: Sit to/from Stand Sit to Stand: Min guard         General transfer comment: min guard for balance and safety    Balance Overall balance assessment: Needs assistance Sitting-balance support: Feet supported Sitting balance-Leahy Scale: Good     Standing balance support: Single extremity supported;During functional activity Standing balance-Leahy Scale: Poor Standing balance comment: able to stand with RW and use unilateral UE to pull pants over hips.                           ADL either performed or assessed with clinical judgement   ADL Overall ADL's : Needs assistance/impaired               Lower Body Bathing Details (indicate cue type and reason): Pt was instructed in use of LH sponge for LB bathing     Lower Body Dressing: Min guard;Sit to/from stand Lower Body Dressing Details (indicate cue type and reason): Pt instructed in  use of AE for LB ADLs, and was able to don shorts sit to stand with RW and min guard assist to pull pants over hips in standing.  He was able to don/doff socks with sock aid and reacher.             Functional mobility during ADLs: Min guard;Minimal assistance       Vision       Perception     Praxis      Cognition Arousal/Alertness: Awake/alert Behavior During Therapy: Flat affect Overall Cognitive Status: Within Functional Limits for tasks assessed                                          Exercises     Shoulder Instructions       General Comments      Pertinent Vitals/ Pain       Pain Assessment: Faces Faces Pain Scale: Hurts little more Pain Location: low back Pain Descriptors / Indicators: Guarding Pain Intervention(s): Monitored during session  Home Living  Prior Functioning/Environment              Frequency  Min 2X/week        Progress Toward Goals  OT Goals(current goals can now be found in the care plan section)  Progress towards OT goals: Progressing toward goals     Plan Discharge plan remains appropriate    Co-evaluation                 AM-PAC OT "6 Clicks" Daily Activity     Outcome Measure   Help from another person eating meals?: None Help from another person taking care of personal grooming?: A Little Help from another person toileting, which includes using toliet, bedpan, or urinal?: A Little Help from another person bathing (including washing, rinsing, drying)?: A Little Help from another person to put on and taking off regular upper body clothing?: A Little Help from another person to put on and taking off regular lower body clothing?: A Little 6 Click Score: 19    End of Session Equipment Utilized During Treatment: Rolling walker  OT Visit Diagnosis: Muscle weakness (generalized) (M62.81)   Activity Tolerance Patient tolerated  treatment well   Patient Left in bed;with call bell/phone within reach;Other (comment) (EOB  _ RN aware)   Nurse Communication Mobility status        Time: [phone] OT Time Calculation (min): 11 min  Charges: OT General Charges $OT Visit: 1 Visit OT Treatments $Self Care/Home Management : 8-22 mins  Eber Jones OTR/L Acute Rehabilitation Services Pager [phone] Office [phone]    Jeani Hawking M 07/26/2020, 10:38 AM

## 2020-07-27 ENCOUNTER — Telehealth: Payer: Self-pay | Admitting: Family Medicine

## 2020-07-27 NOTE — Telephone Encounter (Signed)
Urine culture was collected before I took over his care. I am unclear why it was done or if he was symptomatic.  I called and discussed the result with him. He denies any symptoms except that he self cath to urinate.   As discussed, since he is asymptomatic and this is likely catheter-related, we can watch for now. I will follow up on the sensitivity report as well as with him in the next 2-3 days regarding symptoms.  On Monday, he will contact his urologist to schedule a follow-up appointment for his urinary retention. F/U with neurology and neurosurgery as discussed.

## 2020-07-29 LAB — URINE CULTURE: Culture: >=100000 — AB

## 2020-07-29 MED ORDER — CEPHALEXIN 500 MG PO CAPS: 500.0000 mg | ORAL_CAPSULE | Freq: Two times a day (BID) | ORAL | 0 refills | Status: AC

## 2020-07-29 NOTE — Addendum Note (Signed)
Addended by: Janit Pagan T on: 07/29/2020 04:11 PM   Modules accepted: Orders

## 2020-07-29 NOTE — Telephone Encounter (Signed)
I called to give him his final culture report. He stated that he can't tell if he has urinary symptoms or not. He does have some back pain. He has urology appointment scheduled for 6/17 at 2 PM.  We agreed to put him on a 3 days course of A/B. F/U as needed.
